# Patient Record
Sex: Female | Born: 1980 | Race: White | Hispanic: No | Smoking: Current every day smoker
Health system: Southern US, Community
[De-identification: ages and names within clinical notes are randomized; demographics above are authoritative.]

## PROBLEM LIST (undated history)

## (undated) DIAGNOSIS — G8929 Other chronic pain: Secondary | ICD-10-CM

## (undated) DIAGNOSIS — IMO0002 Reserved for concepts with insufficient information to code with codable children: Secondary | ICD-10-CM

## (undated) DIAGNOSIS — M549 Dorsalgia, unspecified: Secondary | ICD-10-CM

## (undated) DIAGNOSIS — M797 Fibromyalgia: Secondary | ICD-10-CM

## (undated) HISTORY — PX: ACHILLES TENDON REPAIR: SUR1153

## (undated) HISTORY — PX: PLEURAL SCARIFICATION: SHX748

## (undated) HISTORY — PX: TONSILLECTOMY: SUR1361

---

## 2009-12-14 ENCOUNTER — Emergency Department (HOSPITAL_COMMUNITY): Admission: EM | Admit: 2009-12-14 | Discharge: 2009-12-14 | Payer: Self-pay | Admitting: Emergency Medicine

## 2010-12-11 LAB — URINALYSIS, ROUTINE W REFLEX MICROSCOPIC
Nitrite: NEGATIVE
Specific Gravity, Urine: 1.025 (ref 1.005–1.030)
pH: 6.5 (ref 5.0–8.0)

## 2010-12-11 LAB — PREGNANCY, URINE: Preg Test, Ur: NEGATIVE

## 2011-03-27 ENCOUNTER — Encounter: Payer: Self-pay | Admitting: *Deleted

## 2011-03-27 ENCOUNTER — Emergency Department (HOSPITAL_COMMUNITY)
Admission: EM | Admit: 2011-03-27 | Discharge: 2011-03-27 | Disposition: A | Discharge status: Home / Self Care | Payer: Medicaid Other | Attending: Emergency Medicine | Admitting: Emergency Medicine

## 2011-03-27 DIAGNOSIS — M549 Dorsalgia, unspecified: Secondary | ICD-10-CM | POA: Insufficient documentation

## 2011-03-27 DIAGNOSIS — R21 Rash and other nonspecific skin eruption: Secondary | ICD-10-CM | POA: Diagnosis present

## 2011-03-27 DIAGNOSIS — F17200 Nicotine dependence, unspecified, uncomplicated: Secondary | ICD-10-CM | POA: Insufficient documentation

## 2011-03-27 HISTORY — DX: Dorsalgia, unspecified: M54.9

## 2011-03-27 HISTORY — DX: Other chronic pain: G89.29

## 2011-03-27 MED ORDER — OXYCODONE-ACETAMINOPHEN 5-325 MG PO TABS: 1.0000 | ORAL_TABLET | ORAL | Status: AC | PRN

## 2011-03-27 MED ORDER — DOXYCYCLINE HYCLATE 100 MG PO CAPS: 100.0000 mg | ORAL_CAPSULE | Freq: Two times a day (BID) | ORAL | Status: AC

## 2011-03-27 MED ORDER — OXYCODONE-ACETAMINOPHEN 5-325 MG PO TABS
1.0000 | ORAL_TABLET | Freq: Once | ORAL | Status: AC
  Administered 2011-03-27: 1 via ORAL
  Filled 2011-03-27: qty 1

## 2011-03-27 MED ORDER — TETANUS-DIPHTH-ACELL PERTUSSIS 5-2-15.5 LF-MCG/0.5 IM SUSP
0.5000 mL | Freq: Once | INTRAMUSCULAR | Status: AC
  Administered 2011-03-27: 0.5 mL via INTRAMUSCULAR
  Filled 2011-03-27: qty 0.5

## 2011-03-27 NOTE — ED Notes (Signed)
Patient with c/o red, raised rash to back. Patient with new tattoos placed Sunday July 1st. New tattoos have yellow patches and are peeling. Patient reports pain in area. Patient has blister appearing areas to back and under right ear. Patient also reporting lower back pain.

## 2011-03-27 NOTE — ED Notes (Signed)
C/o ? Poison oak to lower back on site of new tattoos; c/o blister like area under right ear on neck first noticed this morning

## 2011-03-27 NOTE — ED Provider Notes (Signed)
History     Chief Complaint  Patient presents with  . Rash   Patient is a 30 y.o. female presenting with rash. The history is provided by the patient. No language interpreter was used.  Rash  This is a new problem. Episode onset: 1 week ago. The problem has not changed since onset.Associated with: new tattoos. There has been no fever. Affected Location: lower back and upper neck. The pain is moderate. The pain has been constant since onset. Associated symptoms include blisters, itching and pain. Pertinent negatives include no weeping. She has tried nothing for the symptoms.  C/o scattered pustules to lower back and upper neck at site of new tatoos onset 1 week ago and persistent since with associated pain with areas surrounding pustules and pruritus. Patient reports she had new tatoos done 9 days ago, with no problems for the first 2 days after tatoos finished but rash beginning 3 days after tatoos. Patient reports tatoos were performed at home and not in tatoo shop. Patient also reports blister with surrounding redness under right ear with associated yellow drainage this morning. Denies fever, recent insect bites and other new exposures. Tetanus status unknown.  Patient seen at 6:04PM    Past Medical History  Diagnosis Date  . Back pain, chronic   . Pneumothorax     Past Surgical History  Procedure Date  . Achilles tendon repair   . Pleural scarification     No family history on file.  History  Substance Use Topics  . Smoking status: Current Everyday Smoker -- 1.0 packs/day    Types: Cigarettes  . Smokeless tobacco: Not on file  . Alcohol Use: No    OB History    Grav Para Term Preterm Abortions TAB SAB Ect Mult Living                  Review of Systems  Constitutional: Negative for fever and chills.  HENT: Negative for neck stiffness.   Respiratory: Negative for shortness of breath.   Cardiovascular: Negative for chest pain.  Gastrointestinal: Negative for abdominal  pain.  Skin: Positive for itching and rash (with associated pain about areas).       Blisters/pustules.   Neurological: Negative for headaches.  All other systems reviewed and are negative.    Physical Exam  BP 127/61  Pulse 83  Temp(Src) 99 F (37.2 C) (Oral)  Resp 20  Ht 5\' 8"  (1.727 m)  Wt 159 lb 5 oz (72.264 kg)  BMI 24.22 kg/m2  SpO2 100%  LMP 03/02/2011  Physical Exam CONSTITUTIONAL: Well developed/well nourished, vitals normal HEAD AND FACE: Normocephalic/atraumatic EYES: EOMI/PERRL ENMT: Mucous membranes moist NECK: supple no meningeal signs SPINE:entire spine nontender CV: S1/S2 noted, no murmurs/rubs/gallops noted LUNGS: Lungs are clear to auscultation bilaterally, no apparent distress ABDOMEN: soft, nontender NEURO: Pt is awake/alert, moves all extremitiesx4 EXTREMITIES: pulses normal, full ROM, no edema SKIN: warm, color normal, small area of erythema under right ear with no drainage and no abscess, Tatoos of upper neck and lower back with small pustules surrounding with no abscess,streaking or crepitus noted. PSYCH: no abnormalities of mood noted  ED Course  Procedures  MDM Nursing notes reviewed and considered in documentation Advised abx, pain control, benadryl and if no improvement f/u with derm  I personally performed the services described in this documentation, which was scribed in my presence. The recorded information has been reviewed and considered.      Joya Gaskins, MD 03/27/11 Rickey Primus

## 2011-04-18 ENCOUNTER — Encounter (HOSPITAL_COMMUNITY): Payer: Self-pay | Admitting: *Deleted

## 2011-04-18 DIAGNOSIS — S335XXA Sprain of ligaments of lumbar spine, initial encounter: Secondary | ICD-10-CM | POA: Insufficient documentation

## 2011-04-18 DIAGNOSIS — G43909 Migraine, unspecified, not intractable, without status migrainosus: Secondary | ICD-10-CM | POA: Insufficient documentation

## 2011-04-18 DIAGNOSIS — X58XXXA Exposure to other specified factors, initial encounter: Secondary | ICD-10-CM | POA: Insufficient documentation

## 2011-04-18 DIAGNOSIS — F17200 Nicotine dependence, unspecified, uncomplicated: Secondary | ICD-10-CM | POA: Insufficient documentation

## 2011-04-18 NOTE — ED Notes (Signed)
C/o lower back pain for years and today migraine HA, denies N/V

## 2011-04-19 ENCOUNTER — Emergency Department (HOSPITAL_COMMUNITY): Payer: Medicaid Other

## 2011-04-19 ENCOUNTER — Emergency Department (HOSPITAL_COMMUNITY)
Admission: EM | Admit: 2011-04-19 | Discharge: 2011-04-19 | Disposition: A | Discharge status: Home / Self Care | Payer: Medicaid Other | Attending: Emergency Medicine | Admitting: Emergency Medicine

## 2011-04-19 DIAGNOSIS — M549 Dorsalgia, unspecified: Secondary | ICD-10-CM

## 2011-04-19 DIAGNOSIS — R51 Headache: Secondary | ICD-10-CM

## 2011-04-19 MED ORDER — TRAMADOL HCL 50 MG PO TABS: 50.0000 mg | ORAL_TABLET | Freq: Four times a day (QID) | ORAL | Status: AC | PRN

## 2011-04-19 MED ORDER — SODIUM CHLORIDE 0.9 % IV SOLN
Freq: Once | INTRAVENOUS | Status: AC
  Administered 2011-04-19: 05:00:00 via INTRAVENOUS

## 2011-04-19 MED ORDER — KETOROLAC TROMETHAMINE 30 MG/ML IJ SOLN
30.0000 mg | Freq: Once | INTRAMUSCULAR | Status: AC
  Administered 2011-04-19: 30 mg via INTRAVENOUS
  Filled 2011-04-19: qty 1

## 2011-04-19 MED ORDER — HYDROMORPHONE HCL 2 MG/ML IJ SOLN
2.0000 mg | Freq: Once | INTRAMUSCULAR | Status: AC
  Administered 2011-04-19: 2 mg via INTRAVENOUS
  Filled 2011-04-19: qty 1

## 2011-04-19 MED ORDER — HYDROMORPHONE HCL 1 MG/ML IJ SOLN
1.0000 mg | Freq: Once | INTRAMUSCULAR | Status: AC
  Administered 2011-04-19: 1 mg via INTRAVENOUS
  Filled 2011-04-19: qty 1

## 2011-04-19 MED ORDER — ONDANSETRON HCL 4 MG/2ML IJ SOLN
4.0000 mg | Freq: Once | INTRAMUSCULAR | Status: AC
  Administered 2011-04-19: 4 mg via INTRAVENOUS
  Filled 2011-04-19: qty 2

## 2011-04-19 NOTE — ED Provider Notes (Signed)
History     CSN: [id number] Arrival date & time: 04/19/2011  1:05 AM  Chief Complaint  Patient presents with  . Back Pain  . Migraine   Patient is a 30 y.o. female presenting with back pain and migraine. The history is provided by the patient.  Back Pain  This is a new (The patient states that she started with lower back pain today after she did much lifting she also complains of severe headache frontal headache) problem. The current episode started yesterday. The problem occurs daily. The problem has not changed since onset.The pain is associated with no known injury. The pain is present in the lumbar spine. The quality of the pain is described as shooting. The pain is at a severity of 3/10. The symptoms are aggravated by twisting. Associated symptoms include headaches. Pertinent negatives include no chest pain, no abdominal pain, no bladder incontinence and no dysuria.  Migraine Associated symptoms include headaches. Pertinent negatives include no chest pain and no abdominal pain.    Past Medical History  Diagnosis Date  . Back pain, chronic   . Pneumothorax   . Migraine     Past Surgical History  Procedure Date  . Achilles tendon repair   . Pleural scarification     History reviewed. No pertinent family history.  History  Substance Use Topics  . Smoking status: Current Everyday Smoker -- 1.0 packs/day    Types: Cigarettes  . Smokeless tobacco: Not on file  . Alcohol Use: No    OB History    Grav Para Term Preterm Abortions TAB SAB Ect Mult Living                  Review of Systems  Constitutional: Negative for fatigue.  HENT: Negative for congestion, sinus pressure and ear discharge.   Eyes: Negative for discharge.  Respiratory: Negative for cough.   Cardiovascular: Negative for chest pain.  Gastrointestinal: Negative for abdominal pain and diarrhea.  Genitourinary: Negative for bladder incontinence, dysuria, frequency and hematuria.  Musculoskeletal: Positive  for back pain.  Skin: Negative for rash.  Neurological: Positive for headaches. Negative for seizures.  Hematological: Negative.   Psychiatric/Behavioral: Negative for hallucinations.    Physical Exam  BP 122/74  Pulse 68  Temp(Src) 98.1 F (36.7 C) (Oral)  Resp 16  Ht 5\' 8"  (1.727 m)  Wt 156 lb (70.761 kg)  BMI 23.72 kg/m2  SpO2 100%  LMP 03/02/2011  Physical Exam  Constitutional: She is oriented to person, place, and time. She appears well-developed.  HENT:  Head: Normocephalic and atraumatic.  Eyes: Conjunctivae and EOM are normal. No scleral icterus.  Neck: Neck supple. No thyromegaly present.  Cardiovascular: Normal rate and regular rhythm.  Exam reveals no gallop and no friction rub.   No murmur heard. Pulmonary/Chest: No stridor. She has no wheezes. She has no rales. She exhibits no tenderness.  Abdominal: She exhibits no distension. There is no tenderness. There is no rebound.  Musculoskeletal: Normal range of motion. She exhibits no edema.       Tender lumbar spine.  Neg straight leg raise  Lymphadenopathy:    She has no cervical adenopathy.  Neurological: She is oriented to person, place, and time. Coordination normal.  Skin: No rash noted. No erythema.  Psychiatric: She has a normal mood and affect. Her behavior is normal.    ED Course  Procedures  MDM    Lumbar strain and migraine headache.  Pt improved with tx  Benny Lennert, MD 04/19/11 [phone]

## 2011-04-19 NOTE — ED Notes (Signed)
Pt states she is not feeling any better at this time. Pt requesting pain medication at this time. EDP notified.

## 2011-04-19 NOTE — ED Notes (Signed)
Chronic back pain, no PCP or pain management dr. Andrey Cota she gets a shot of dilauid for her HA.

## 2011-05-25 ENCOUNTER — Ambulatory Visit (INDEPENDENT_AMBULATORY_CARE_PROVIDER_SITE_OTHER): Payer: Medicaid Other | Admitting: Family Medicine

## 2011-05-25 ENCOUNTER — Encounter: Payer: Self-pay | Admitting: Family Medicine

## 2011-05-25 ENCOUNTER — Ambulatory Visit (HOSPITAL_COMMUNITY)
Admission: RE | Admit: 2011-05-25 | Discharge: 2011-05-25 | Disposition: A | Discharge status: Home / Self Care | Payer: Medicaid Other | Source: Ambulatory Visit | Attending: Family Medicine | Admitting: Family Medicine

## 2011-05-25 VITALS — BP 124/80 | HR 72 | Resp 16 | Ht 68.0 in | Wt 152.1 lb

## 2011-05-25 DIAGNOSIS — Z720 Tobacco use: Secondary | ICD-10-CM

## 2011-05-25 DIAGNOSIS — R059 Cough, unspecified: Secondary | ICD-10-CM | POA: Insufficient documentation

## 2011-05-25 DIAGNOSIS — Z23 Encounter for immunization: Secondary | ICD-10-CM

## 2011-05-25 DIAGNOSIS — R05 Cough: Secondary | ICD-10-CM | POA: Insufficient documentation

## 2011-05-25 DIAGNOSIS — F410 Panic disorder [episodic paroxysmal anxiety] without agoraphobia: Secondary | ICD-10-CM

## 2011-05-25 DIAGNOSIS — M549 Dorsalgia, unspecified: Secondary | ICD-10-CM | POA: Insufficient documentation

## 2011-05-25 DIAGNOSIS — F17200 Nicotine dependence, unspecified, uncomplicated: Secondary | ICD-10-CM | POA: Insufficient documentation

## 2011-05-25 MED ORDER — INFLUENZA VAC TYPES A & B PF IM SUSP: 0.5000 mL | Freq: Once | INTRAMUSCULAR | Status: DC

## 2011-05-25 MED ORDER — GUAIFENESIN-CODEINE 100-10 MG/5ML PO SYRP: 5.0000 mL | ORAL_SOLUTION | Freq: Two times a day (BID) | ORAL | Status: DC | PRN

## 2011-05-25 NOTE — Assessment & Plan Note (Signed)
And will have her followup with her orthopedic surgeon. No change in medications. I advised her not treat her pain with hydrocodone which she pulled an old prescription from her purse. I do not know what actual pathology she has in her back at this time. No red flags on exam today

## 2011-05-25 NOTE — Assessment & Plan Note (Signed)
We will have to discuss this in more detail. It appears the [facility] in [place] is actually prescribing her Xanax for her panic attacks. I will obtain records from their office

## 2011-05-25 NOTE — Assessment & Plan Note (Signed)
Persistent cough x2 months. She is a smoker therefore will obtain an x-ray. Based on exam likely viral as well as the history with her children being sick. Given Robitussin A.C.

## 2011-05-25 NOTE — Progress Notes (Signed)
  Subjective:    Patient ID: Faith Santos, female    DOB: 04-Jul-1981, 30 y.o.   MRN: [id number]  HPI  Pt here to establish care, medications and history reviewed   1. Back pain- chronic back pain for years, recently has been more progressive, states she has disc problems, had a recent MRI, she follows with [facility]- Dr. Fonnie Jarvis, seen approx 2 weeks ago, injured her back lifting, has lower back pain radiates to both hip L >R, and radiation to left knee. She is very stiff in the morning. Stay at home mother. ROS- no change in bowel or bladder, +tingling in left foot    Currently taking Meloxicam and Robaxin, s/p Medrol dose pak  Seen in ED in August for pain. Asking for pain medication today   2. Cough- 2 months of , productive cough- yellow sputum, smoker- 1ppd, no history of lung disease, no wheezing, no SOB, No CP. Her children have had viruses back and forth   3. Panic attacks-- diagnosed 2 years ago, has been on xanax, takes three times a day -- diagnosed by the [facility], husband went to prison 2 years ago, single parent    4. PAP smear done in April-- Started on Depo in April 2012, next shot due in October 2012 - will continue to go to the [facility]           Review of Systems    GEN- denies fatigue, fever, weight loss,weakness,  HEENT- denies eye drainage, change in vision, nasal discharge, CVS- denies chest pain, palpitations RESP- denies SOB, +cough, wheeze MSK- + joint pain, muscle aches, injury Psych- +panic attacks, +stress        Objective:   Physical Exam GEN- NAD, alert and oriented x3, pleasant HEENT- PERRL, EOMI, non injected sclera, pink conjunctiva, MMM, mild injection of oropharynx, Fundoscopic exam benign Neck- Supple, no thryomegaly CVS- RRR, no murmur RESP-CTAB, harsh cough, no wheeze  EXT- No edema Neuro- +SLR on left side, DTR symmetric bilat, Motor 4+/5 Left, Right 5/5, sensation grossly in tact  MSK- TTP lumbar  spine, paraspinals b is ailat, pain with extension and flexion  Pulses- Radial, DP- 2+ Skin-  Multiple tattoos Psych- not anxious or depressed appearing, normal mentation     Assessment & Plan:

## 2011-05-25 NOTE — Assessment & Plan Note (Signed)
Patient advised to quit smoking. She does not appear ready at this time. She uses it as stress relief

## 2011-05-25 NOTE — Progress Notes (Signed)
Addended by: Adella Hare B on: 05/25/2011 10:08 AM   Modules accepted: Orders

## 2011-05-25 NOTE — Patient Instructions (Signed)
We will get an x-ray of your lungs Use the cough medicine as needed  I have given you a flu shot Follow-up with your back doctor I will get records from the [facility] I recommend that you quit smoking!  F/U 6 weeks

## 2011-05-30 ENCOUNTER — Encounter: Payer: Self-pay | Admitting: Family Medicine

## 2011-05-31 ENCOUNTER — Ambulatory Visit (INDEPENDENT_AMBULATORY_CARE_PROVIDER_SITE_OTHER): Payer: Medicaid Other | Admitting: Family Medicine

## 2011-05-31 ENCOUNTER — Encounter: Payer: Self-pay | Admitting: Family Medicine

## 2011-05-31 VITALS — BP 130/82 | HR 66 | Resp 16 | Ht 68.0 in | Wt 151.1 lb

## 2011-05-31 DIAGNOSIS — M549 Dorsalgia, unspecified: Secondary | ICD-10-CM

## 2011-05-31 MED ORDER — TRAMADOL HCL 50 MG PO TABS: 50.0000 mg | ORAL_TABLET | Freq: Three times a day (TID) | ORAL | Status: AC

## 2011-05-31 NOTE — Patient Instructions (Signed)
For your back, continue the muscle relaxant as needed Use the ultram three times a day as needed for severe pain  Use heating pad as needed Avoid any heavy lifting Start on the exercises, since you can not make it to Physical therapy

## 2011-06-01 NOTE — Assessment & Plan Note (Addendum)
Patient has  very mild pathology in the back. This should not cause severe pain. It is likely that she is under a lot of stress as she is mentioned before with her home life situation. I discussed with her she does not need narcotic medication to treat her back pain. I have given her a trial of tramadol and she may continue the Robaxin as needed. She was also given a handout on back exercises. I also agreed that she may not need steroid injections at this time. Of note I did call the orthopedic office in his nurse reviewed and noted with me there was no mention of any intervention such as steroid injections she was referred to physical therapy which she declined. The note also states she's to followup when necessary

## 2011-06-01 NOTE — Progress Notes (Signed)
  Subjective:    Patient ID: Faith Santos, female    DOB: 1981/08/10, 30 y.o.   MRN: [id number]  HPI  Patient here to followup back pain. She had  MRI done by [facility] orthopedics 05/04/11. MRI of lumbar spine results mild disc degeneration with tear of our annulus at this right posterior lateral quadrant. No neural displacement. Mild facet arthrosis, no evidence of nerve compression. Patient states her pain is severe daily she almost goes to the ED most nights. She would like something for pain as the orthopedic doctor did not give her anything. She also states that he said she would need steroid injections in her back however she does not want to pursue this. There is been no change in bowel or bladder since our last visit one week ago.   Review of Systems No change in bowel or bladder,no new parethesia     Objective:   Physical Exam GEN- NAD, alert and oriented, comfortable sitting in chair  Neuro- no change in gait       Assessment & Plan:

## 2011-06-11 ENCOUNTER — Telehealth: Payer: Self-pay | Admitting: Family Medicine

## 2011-06-11 NOTE — Telephone Encounter (Signed)
Wants to know if there is something else she can take for her back pain since the tramadol isn't helping?

## 2011-06-11 NOTE — Telephone Encounter (Signed)
pls send to PCP, Dr. Jeanice Lim

## 2011-06-12 ENCOUNTER — Telehealth: Payer: Self-pay | Admitting: Family Medicine

## 2011-06-12 NOTE — Telephone Encounter (Signed)
Called patient no answer.

## 2011-06-12 NOTE — Telephone Encounter (Signed)
In my note I discussed with her at the last visit , i will not treat her with narcotics. She can take Aleve or tylenol over the counter She can also increase the bedtime dose of ultram to 100mg 

## 2011-06-12 NOTE — Telephone Encounter (Signed)
Patient aware and said she will just have to go to the ER

## 2011-06-12 NOTE — Telephone Encounter (Signed)
Please see below message.

## 2011-06-12 NOTE — Telephone Encounter (Signed)
See previous note

## 2011-06-28 ENCOUNTER — Telehealth: Payer: Self-pay | Admitting: Family Medicine

## 2011-07-02 ENCOUNTER — Telehealth: Payer: Self-pay | Admitting: Family Medicine

## 2011-07-02 NOTE — Telephone Encounter (Signed)
As I told her a few weeks ago, I will not prescribe narcotics, she can use Aleve, or the ultram.

## 2011-07-05 NOTE — Telephone Encounter (Signed)
Duplicate message regarding same issue. See previous note

## 2011-07-06 ENCOUNTER — Ambulatory Visit: Payer: Medicaid Other | Admitting: Family Medicine

## 2011-07-06 ENCOUNTER — Telehealth: Payer: Self-pay | Admitting: Family Medicine

## 2011-07-10 MED ORDER — GUAIFENESIN-CODEINE 100-10 MG/5ML PO SYRP: 5.0000 mL | ORAL_SOLUTION | Freq: Two times a day (BID) | ORAL | Status: AC | PRN

## 2011-07-10 NOTE — Telephone Encounter (Signed)
Refill sent.

## 2013-05-09 ENCOUNTER — Emergency Department (HOSPITAL_COMMUNITY): Payer: Medicaid Other

## 2013-05-09 ENCOUNTER — Encounter (HOSPITAL_COMMUNITY): Payer: Self-pay | Admitting: *Deleted

## 2013-05-09 ENCOUNTER — Emergency Department (HOSPITAL_COMMUNITY)
Admission: EM | Admit: 2013-05-09 | Discharge: 2013-05-09 | Disposition: A | Discharge status: Home / Self Care | Payer: Medicaid Other | Attending: Emergency Medicine | Admitting: Emergency Medicine

## 2013-05-09 DIAGNOSIS — Z791 Long term (current) use of non-steroidal anti-inflammatories (NSAID): Secondary | ICD-10-CM | POA: Insufficient documentation

## 2013-05-09 DIAGNOSIS — S300XXA Contusion of lower back and pelvis, initial encounter: Secondary | ICD-10-CM

## 2013-05-09 DIAGNOSIS — Z79899 Other long term (current) drug therapy: Secondary | ICD-10-CM | POA: Insufficient documentation

## 2013-05-09 DIAGNOSIS — Z8709 Personal history of other diseases of the respiratory system: Secondary | ICD-10-CM | POA: Insufficient documentation

## 2013-05-09 DIAGNOSIS — S20229A Contusion of unspecified back wall of thorax, initial encounter: Secondary | ICD-10-CM | POA: Insufficient documentation

## 2013-05-09 DIAGNOSIS — W010XXA Fall on same level from slipping, tripping and stumbling without subsequent striking against object, initial encounter: Secondary | ICD-10-CM | POA: Insufficient documentation

## 2013-05-09 DIAGNOSIS — IMO0001 Reserved for inherently not codable concepts without codable children: Secondary | ICD-10-CM | POA: Insufficient documentation

## 2013-05-09 DIAGNOSIS — F17200 Nicotine dependence, unspecified, uncomplicated: Secondary | ICD-10-CM | POA: Insufficient documentation

## 2013-05-09 DIAGNOSIS — Y9389 Activity, other specified: Secondary | ICD-10-CM | POA: Insufficient documentation

## 2013-05-09 DIAGNOSIS — S8990XA Unspecified injury of unspecified lower leg, initial encounter: Secondary | ICD-10-CM | POA: Insufficient documentation

## 2013-05-09 DIAGNOSIS — W108XXA Fall (on) (from) other stairs and steps, initial encounter: Secondary | ICD-10-CM | POA: Insufficient documentation

## 2013-05-09 DIAGNOSIS — M5137 Other intervertebral disc degeneration, lumbosacral region: Secondary | ICD-10-CM | POA: Insufficient documentation

## 2013-05-09 DIAGNOSIS — M5136 Other intervertebral disc degeneration, lumbar region: Secondary | ICD-10-CM

## 2013-05-09 DIAGNOSIS — M51379 Other intervertebral disc degeneration, lumbosacral region without mention of lumbar back pain or lower extremity pain: Secondary | ICD-10-CM | POA: Insufficient documentation

## 2013-05-09 DIAGNOSIS — Y9289 Other specified places as the place of occurrence of the external cause: Secondary | ICD-10-CM | POA: Insufficient documentation

## 2013-05-09 DIAGNOSIS — G8929 Other chronic pain: Secondary | ICD-10-CM | POA: Insufficient documentation

## 2013-05-09 DIAGNOSIS — Z8679 Personal history of other diseases of the circulatory system: Secondary | ICD-10-CM | POA: Insufficient documentation

## 2013-05-09 HISTORY — DX: Fibromyalgia: M79.7

## 2013-05-09 HISTORY — DX: Reserved for concepts with insufficient information to code with codable children: IMO0002

## 2013-05-09 MED ORDER — OXYCODONE-ACETAMINOPHEN 5-325 MG PO TABS
1.0000 | ORAL_TABLET | Freq: Once | ORAL | Status: AC
  Administered 2013-05-09: 1 via ORAL
  Filled 2013-05-09: qty 1

## 2013-05-09 MED ORDER — NAPROXEN 500 MG PO TABS: 500.0000 mg | ORAL_TABLET | Freq: Two times a day (BID) | ORAL | Status: DC

## 2013-05-09 MED ORDER — HYDROCODONE-ACETAMINOPHEN 5-325 MG PO TABS: 1.0000 | ORAL_TABLET | ORAL | Status: DC | PRN

## 2013-05-09 MED ORDER — CYCLOBENZAPRINE HCL 10 MG PO TABS: 10.0000 mg | ORAL_TABLET | Freq: Two times a day (BID) | ORAL | Status: DC | PRN

## 2013-05-09 NOTE — ED Notes (Addendum)
Pt states that she slipped on her steps causing her to slide down the steps hitting her lower back area, states that the pain radiates down both legs.
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
## 2013-05-09 NOTE — Discharge Instructions (Signed)
Contusion A contusion is a deep bruise. Contusions are the result of an injury that caused bleeding under the skin. The contusion may turn blue, purple, or yellow. Minor injuries will give you a painless contusion, but more severe contusions may stay painful and swollen for a few weeks.  CAUSES  A contusion is usually caused by a blow, trauma, or direct force to an area of the body. SYMPTOMS   Swelling and redness of the injured area.  Bruising of the injured area.  Tenderness and soreness of the injured area.  Pain. DIAGNOSIS  The diagnosis can be made by taking a history and physical exam. An X-ray, CT scan, or MRI may be needed to determine if there were any associated injuries, such as fractures. TREATMENT  Specific treatment will depend on what area of the body was injured. In general, the best treatment for a contusion is resting, icing, elevating, and applying cold compresses to the injured area. Over-the-counter medicines may also be recommended for pain control. Ask your caregiver what the best treatment is for your contusion. HOME CARE INSTRUCTIONS   Put ice on the injured area.  Put ice in a plastic bag.  Place a towel between your skin and the bag.  Leave the ice on for 15-20 minutes, 3-4 times a day.  Only take over-the-counter or prescription medicines for pain, discomfort, or fever as directed by your caregiver. Your caregiver may recommend avoiding anti-inflammatory medicines (aspirin, ibuprofen, and naproxen) for 48 hours because these medicines may increase bruising.  Rest the injured area.  If possible, elevate the injured area to reduce swelling. SEEK IMMEDIATE MEDICAL CARE IF:   You have increased bruising or swelling.  You have pain that is getting worse.  Your swelling or pain is not relieved with medicines. MAKE SURE YOU:   Understand these instructions.  Will watch your condition.  Will get help right away if you are not doing well or get  worse. Document Released: 06/13/2005 Document Revised: 11/26/2011 Document Reviewed: 07/09/2011 [employer] Patient Information 2014 [employer].  

## 2013-05-09 NOTE — ED Notes (Signed)
Ride for pt verified,

## 2013-05-10 NOTE — ED Provider Notes (Signed)
Medical screening examination/treatment/procedure(s) were performed by non-physician practitioner and as supervising physician I was immediately available for consultation/collaboration.  Hurman Horn, MD 05/10/13 [phone]

## 2013-05-11 ENCOUNTER — Telehealth: Payer: Self-pay | Admitting: Orthopedic Surgery

## 2013-05-11 NOTE — Telephone Encounter (Signed)
Patient called to request appointment for back pain as follow-up from [facility] ER.  She as [employer], told her we will need a referral from the PCP on her card.  Also said she had been seen at [facility] about 2 years ago for her back.  Told her she will also need to get those notes for review  By Dr. Romeo Apple.

## 2013-12-17 ENCOUNTER — Emergency Department (HOSPITAL_COMMUNITY): Payer: Medicaid Other

## 2013-12-17 ENCOUNTER — Encounter (HOSPITAL_COMMUNITY): Payer: Self-pay | Admitting: Emergency Medicine

## 2013-12-17 ENCOUNTER — Emergency Department (HOSPITAL_COMMUNITY)
Admission: EM | Admit: 2013-12-17 | Discharge: 2013-12-17 | Disposition: A | Discharge status: Home / Self Care | Payer: Medicaid Other | Attending: Emergency Medicine | Admitting: Emergency Medicine

## 2013-12-17 DIAGNOSIS — Y929 Unspecified place or not applicable: Secondary | ICD-10-CM | POA: Insufficient documentation

## 2013-12-17 DIAGNOSIS — F17200 Nicotine dependence, unspecified, uncomplicated: Secondary | ICD-10-CM | POA: Insufficient documentation

## 2013-12-17 DIAGNOSIS — Y9301 Activity, walking, marching and hiking: Secondary | ICD-10-CM | POA: Insufficient documentation

## 2013-12-17 DIAGNOSIS — G8929 Other chronic pain: Secondary | ICD-10-CM | POA: Insufficient documentation

## 2013-12-17 DIAGNOSIS — Z8679 Personal history of other diseases of the circulatory system: Secondary | ICD-10-CM | POA: Insufficient documentation

## 2013-12-17 DIAGNOSIS — S93401A Sprain of unspecified ligament of right ankle, initial encounter: Secondary | ICD-10-CM

## 2013-12-17 DIAGNOSIS — X500XXA Overexertion from strenuous movement or load, initial encounter: Secondary | ICD-10-CM | POA: Insufficient documentation

## 2013-12-17 DIAGNOSIS — S93409A Sprain of unspecified ligament of unspecified ankle, initial encounter: Secondary | ICD-10-CM | POA: Insufficient documentation

## 2013-12-17 DIAGNOSIS — Z79899 Other long term (current) drug therapy: Secondary | ICD-10-CM | POA: Insufficient documentation

## 2013-12-17 DIAGNOSIS — Z8709 Personal history of other diseases of the respiratory system: Secondary | ICD-10-CM | POA: Insufficient documentation

## 2013-12-17 DIAGNOSIS — Z8739 Personal history of other diseases of the musculoskeletal system and connective tissue: Secondary | ICD-10-CM | POA: Insufficient documentation

## 2013-12-17 MED ORDER — IBUPROFEN 400 MG PO TABS
600.0000 mg | ORAL_TABLET | Freq: Once | ORAL | Status: AC
  Administered 2013-12-17: 600 mg via ORAL
  Filled 2013-12-17: qty 2

## 2013-12-17 MED ORDER — IBUPROFEN 600 MG PO TABS: 600.0000 mg | ORAL_TABLET | Freq: Three times a day (TID) | ORAL | Status: DC | PRN
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
## 2013-12-17 NOTE — ED Notes (Signed)
Pt c/o right ankle pain after twisting it walking up porch stairs today.

## 2013-12-17 NOTE — Discharge Instructions (Signed)

## 2014-08-30 ENCOUNTER — Emergency Department (HOSPITAL_COMMUNITY)
Admission: EM | Admit: 2014-08-30 | Discharge: 2014-08-30 | Disposition: A | Discharge status: Home / Self Care | Payer: Medicaid Other | Attending: Emergency Medicine | Admitting: Emergency Medicine

## 2014-08-30 ENCOUNTER — Encounter (HOSPITAL_COMMUNITY): Payer: Self-pay | Admitting: *Deleted

## 2014-08-30 DIAGNOSIS — Z79899 Other long term (current) drug therapy: Secondary | ICD-10-CM | POA: Diagnosis not present

## 2014-08-30 DIAGNOSIS — K0889 Other specified disorders of teeth and supporting structures: Secondary | ICD-10-CM

## 2014-08-30 DIAGNOSIS — Z720 Tobacco use: Secondary | ICD-10-CM | POA: Insufficient documentation

## 2014-08-30 DIAGNOSIS — G8929 Other chronic pain: Secondary | ICD-10-CM | POA: Insufficient documentation

## 2014-08-30 DIAGNOSIS — Z8679 Personal history of other diseases of the circulatory system: Secondary | ICD-10-CM | POA: Insufficient documentation

## 2014-08-30 DIAGNOSIS — K088 Other specified disorders of teeth and supporting structures: Secondary | ICD-10-CM | POA: Insufficient documentation

## 2014-08-30 MED ORDER — AMOXICILLIN 500 MG PO CAPS: 500.0000 mg | ORAL_CAPSULE | Freq: Three times a day (TID) | ORAL | Status: DC

## 2014-08-30 MED ORDER — AMOXICILLIN 250 MG PO CAPS
500.0000 mg | ORAL_CAPSULE | Freq: Once | ORAL | Status: AC
  Administered 2014-08-30: 500 mg via ORAL
  Filled 2014-08-30: qty 2

## 2014-08-30 MED ORDER — IBUPROFEN 800 MG PO TABS
800.0000 mg | ORAL_TABLET | Freq: Once | ORAL | Status: AC
  Administered 2014-08-30: 800 mg via ORAL
  Filled 2014-08-30: qty 1

## 2014-08-30 NOTE — Discharge Instructions (Signed)
Please ask your pharmacist about temporary filling. Please use Amoxil 3 times daily with food. May continue your ibuprofen and Ultram. It is important that she see her dentist as sone as possible. Dental Pain Toothache is pain in or around a tooth. It may get worse with chewing or with cold or heat.  HOME CARE  Your dentist may use a numbing medicine during treatment. If so, you may need to avoid eating until the medicine wears off. Ask your dentist about this.  Only take medicine as told by your dentist or doctor.  Avoid chewing food near the painful tooth until after all treatment is done. Ask your dentist about this. GET HELP RIGHT AWAY IF:   The problem gets worse or new problems appear.  You have a fever.  There is redness and puffiness (swelling) of the face, jaw, or neck.  You cannot open your mouth.  There is pain in the jaw.  There is very bad pain that is not helped by medicine. MAKE SURE YOU:   Understand these instructions.  Will watch your condition.  Will get help right away if you are not doing well or get worse. Document Released: 02/20/2008 Document Revised: 11/26/2011 Document Reviewed: 02/20/2008 [facility] Patient Information 2015 [place], [place]. This information is not intended to replace advice given to you by your health care provider. Make sure you discuss any questions you have with your health care provider.
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
## 2014-11-29 ENCOUNTER — Emergency Department (HOSPITAL_COMMUNITY): Payer: Medicaid Other

## 2014-11-29 ENCOUNTER — Emergency Department (HOSPITAL_COMMUNITY)
Admission: EM | Admit: 2014-11-29 | Discharge: 2014-11-29 | Disposition: A | Discharge status: Home / Self Care | Payer: Medicaid Other | Attending: Emergency Medicine | Admitting: Emergency Medicine

## 2014-11-29 ENCOUNTER — Encounter (HOSPITAL_COMMUNITY): Payer: Self-pay | Admitting: Emergency Medicine

## 2014-11-29 DIAGNOSIS — Z720 Tobacco use: Secondary | ICD-10-CM | POA: Diagnosis not present

## 2014-11-29 DIAGNOSIS — Y92410 Unspecified street and highway as the place of occurrence of the external cause: Secondary | ICD-10-CM | POA: Diagnosis not present

## 2014-11-29 DIAGNOSIS — Z8679 Personal history of other diseases of the circulatory system: Secondary | ICD-10-CM | POA: Diagnosis not present

## 2014-11-29 DIAGNOSIS — S42102A Fracture of unspecified part of scapula, left shoulder, initial encounter for closed fracture: Secondary | ICD-10-CM

## 2014-11-29 DIAGNOSIS — Y998 Other external cause status: Secondary | ICD-10-CM | POA: Diagnosis not present

## 2014-11-29 DIAGNOSIS — Z791 Long term (current) use of non-steroidal anti-inflammatories (NSAID): Secondary | ICD-10-CM | POA: Diagnosis not present

## 2014-11-29 DIAGNOSIS — Z79899 Other long term (current) drug therapy: Secondary | ICD-10-CM | POA: Diagnosis not present

## 2014-11-29 DIAGNOSIS — S161XXA Strain of muscle, fascia and tendon at neck level, initial encounter: Secondary | ICD-10-CM | POA: Diagnosis not present

## 2014-11-29 DIAGNOSIS — S0990XA Unspecified injury of head, initial encounter: Secondary | ICD-10-CM | POA: Diagnosis not present

## 2014-11-29 DIAGNOSIS — M797 Fibromyalgia: Secondary | ICD-10-CM | POA: Diagnosis not present

## 2014-11-29 DIAGNOSIS — G8929 Other chronic pain: Secondary | ICD-10-CM | POA: Diagnosis not present

## 2014-11-29 DIAGNOSIS — S20219A Contusion of unspecified front wall of thorax, initial encounter: Secondary | ICD-10-CM | POA: Insufficient documentation

## 2014-11-29 DIAGNOSIS — Y9389 Activity, other specified: Secondary | ICD-10-CM | POA: Diagnosis not present

## 2014-11-29 DIAGNOSIS — S42112A Displaced fracture of body of scapula, left shoulder, initial encounter for closed fracture: Secondary | ICD-10-CM | POA: Insufficient documentation

## 2014-11-29 DIAGNOSIS — Z7952 Long term (current) use of systemic steroids: Secondary | ICD-10-CM | POA: Diagnosis not present

## 2014-11-29 DIAGNOSIS — S4992XA Unspecified injury of left shoulder and upper arm, initial encounter: Secondary | ICD-10-CM | POA: Diagnosis present

## 2014-11-29 MED ORDER — SODIUM CHLORIDE 0.9 % IJ SOLN
INTRAMUSCULAR | Status: DC
Start: 2014-11-29 — End: 2014-11-30
  Filled 2014-11-29: qty 250

## 2014-11-29 MED ORDER — MORPHINE SULFATE 4 MG/ML IJ SOLN
INTRAMUSCULAR | Status: AC
  Filled 2014-11-29: qty 1

## 2014-11-29 MED ORDER — IOHEXOL 300 MG/ML  SOLN
80.0000 mL | Freq: Once | INTRAMUSCULAR | Status: AC | PRN
  Administered 2014-11-29: 80 mL via INTRAVENOUS

## 2014-11-29 MED ORDER — OXYCODONE-ACETAMINOPHEN 5-325 MG PO TABS: 1.0000 | ORAL_TABLET | Freq: Four times a day (QID) | ORAL | Status: DC | PRN

## 2014-11-29 MED ORDER — MORPHINE SULFATE 4 MG/ML IJ SOLN
4.0000 mg | Freq: Once | INTRAMUSCULAR | Status: AC
  Administered 2014-11-29: 4 mg via INTRAVENOUS

## 2014-11-29 MED ORDER — MORPHINE SULFATE 4 MG/ML IJ SOLN
4.0000 mg | Freq: Once | INTRAMUSCULAR | Status: AC
  Administered 2014-11-29: 4 mg via INTRAVENOUS
  Filled 2014-11-29: qty 1

## 2014-11-29 MED ORDER — SODIUM CHLORIDE 0.9 % IJ SOLN
INTRAMUSCULAR | Status: AC
  Filled 2014-11-29: qty 45

## 2014-11-29 NOTE — Discharge Instructions (Signed)
Wear arm sling as applied.  Percocet as needed for pain.  Follow-up with orthopedics the end of this week. Call Dr. Mort Sawyers office to arrange this appointment. His contact information has been provided in this discharge summary.  Return to the emergency department if your symptoms substantially worsen or change.   Scapular Fracture You have a fracture (break in bone) of your scapula. This is your shoulder blade. It is the large flat bone behind your shoulder. This is also the bone that makes up the ball and socket joint of your shoulder. Most of the time surgery is not required for injuries to this bone unless the socket of the shoulder joint is involved. DIAGNOSIS  Because of the severity of force usually required to break this bone, x-rays are often taken of other bones likely to be injured at the same time. X-rays of the hip, knee, and pelvis may be taken. Specialized x-rays (arteriograms) may be needed if there are injuries to large blood vessels associated with this injury. HOME CARE INSTRUCTIONS   Simple fractures of the scapula can be treated with a sling and swathe type of immobilization. This means the involved area is held in place by putting the arm in a sling. A wrap is made around the upper arm with the sling holding the arm next to the chest. This may be removed for bathing as instructed by your caregiver.  Apply ice to the injury for 15-20 minutes 03-04 times per day. Put the ice in a plastic bag. Place a towel between the bag of ice and your skin, splint, or immobilization device.  Do not resume use until instructed by your caregiver. Usually full rehabilitation (exercises to improve the injury site) will begin sometime after the sling and swathe are removed. Then begin use gradually as directed. Do not increase use to the point of pain. If pain develops, decrease use and continue the above measures. Slowly increase activities that do not cause discomfort until you gradually  achieve normal use without pain.  Only take over-the-counter or prescription medicines for pain, discomfort, or fever as directed by your caregiver. SEEK IMMEDIATE MEDICAL CARE IF:   Your pain and swelling increase and is uncontrolled with medications.  You develop new, unexplained symptoms or an increase of the symptoms which brought you to your caregiver.  You develop shortness or breath or cough up blood.  You are unable to move your arm or fingers. You develop warmth and swelling in your affected arm.  You develop an unexplained temperature. Document Released: 09/03/2005 Document Revised: 11/26/2011 Document Reviewed: 07/26/2006 [facility] Patient Information 2015 [place], [place]. This information is not intended to replace advice given to you by your health care provider. Make sure you discuss any questions you have with your health care provider.  Head Injury You have a head injury. Headaches and throwing up (vomiting) are common after a head injury. It should be easy to wake up from sleeping. Sometimes you must stay in the hospital. Most problems happen within the first 24 hours. Side effects may occur up to 7-10 days after the injury.  WHAT ARE THE TYPES OF HEAD INJURIES? Head injuries can be as minor as a bump. Some head injuries can be more severe. More severe head injuries include:  A jarring injury to the brain (concussion).  A bruise of the brain (contusion). This mean there is bleeding in the brain that can cause swelling.  A cracked skull (skull fracture).  Bleeding in the brain that collects,  clots, and forms a bump (hematoma). WHEN SHOULD I GET HELP RIGHT AWAY?   You are confused or sleepy.  You cannot be woken up.  You feel sick to your stomach (nauseous) or keep throwing up (vomiting).  Your dizziness or unsteadiness is getting worse.  You have very bad, lasting headaches that are not helped by medicine. Take medicines only as told by your doctor.  You cannot  use your arms or legs like normal.  You cannot walk.  You notice changes in the black spots in the center of the colored part of your eye (pupil).  You have clear or bloody fluid coming from your nose or ears.  You have trouble seeing. During the next 24 hours after the injury, you must stay with someone who can watch you. This person should get help right away (call 911 in the [place] if you start to shake and are not able to control it (have seizures), you pass out, or you are unable to wake up. HOW CAN I PREVENT A HEAD INJURY IN THE FUTURE?  Wear seat belts.  Wear a helmet while bike riding and playing sports like football.  Stay away from dangerous activities around the house. WHEN CAN I RETURN TO NORMAL ACTIVITIES AND ATHLETICS? See your doctor before doing these activities. You should not do normal activities or play contact sports until 1 week after the following symptoms have stopped:  Headache that does not go away.  Dizziness.  Poor attention.  Confusion.  Memory problems.  Sickness to your stomach or throwing up.  Tiredness.  Fussiness.  Bothered by bright lights or loud noises.  Anxiousness or depression.  Restless sleep. MAKE SURE YOU:   Understand these instructions.  Will watch your condition.  Will get help right away if you are not doing well or get worse. Document Released: 08/16/2008 Document Revised: 01/18/2014 Document Reviewed: 05/11/2013 [facility] Patient Information 2015 [place], [place]. This information is not intended to replace advice given to you by your health care provider. Make sure you discuss any questions you have with your health care provider.
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
## 2014-12-06 ENCOUNTER — Encounter: Payer: Self-pay | Admitting: Orthopedic Surgery

## 2014-12-06 ENCOUNTER — Ambulatory Visit (INDEPENDENT_AMBULATORY_CARE_PROVIDER_SITE_OTHER): Payer: Medicaid Other | Admitting: Orthopedic Surgery

## 2014-12-06 VITALS — BP 112/76 | Ht 68.0 in | Wt 153.0 lb

## 2014-12-06 DIAGNOSIS — S42112A Displaced fracture of body of scapula, left shoulder, initial encounter for closed fracture: Secondary | ICD-10-CM | POA: Diagnosis not present

## 2014-12-06 MED ORDER — CYCLOBENZAPRINE HCL 10 MG PO TABS: 10.0000 mg | ORAL_TABLET | Freq: Two times a day (BID) | ORAL | Status: DC | PRN

## 2014-12-06 MED ORDER — OXYCODONE-ACETAMINOPHEN 5-325 MG PO TABS: 1.0000 | ORAL_TABLET | ORAL | Status: DC | PRN

## 2014-12-06 NOTE — Patient Instructions (Addendum)
Stay in sling unless bathing  Continue Meloxicam  Out of work note
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
## 2014-12-20 ENCOUNTER — Telehealth: Payer: Self-pay | Admitting: Orthopedic Surgery

## 2014-12-20 ENCOUNTER — Other Ambulatory Visit: Payer: Self-pay | Admitting: *Deleted

## 2014-12-20 MED ORDER — OXYCODONE-ACETAMINOPHEN 5-325 MG PO TABS: 1.0000 | ORAL_TABLET | ORAL | Status: DC | PRN

## 2014-12-20 NOTE — Telephone Encounter (Signed)
Patient called to request refill on pain medication: oxyCODONE-acetaminophen (PERCOCET/ROXICET) 5-325 MG per tablet - ph# [phone]

## 2014-12-20 NOTE — Telephone Encounter (Signed)
Prescription available, patient aware  

## 2014-12-21 NOTE — Telephone Encounter (Signed)
Patient picked up Rx

## 2014-12-27 ENCOUNTER — Ambulatory Visit (INDEPENDENT_AMBULATORY_CARE_PROVIDER_SITE_OTHER): Payer: Self-pay | Admitting: Orthopedic Surgery

## 2014-12-27 VITALS — BP 140/105 | Ht 68.0 in | Wt 153.0 lb

## 2014-12-27 DIAGNOSIS — S42112D Displaced fracture of body of scapula, left shoulder, subsequent encounter for fracture with routine healing: Secondary | ICD-10-CM

## 2014-12-27 MED ORDER — HYDROCODONE-ACETAMINOPHEN 10-325 MG PO TABS: 1.0000 | ORAL_TABLET | ORAL | Status: DC | PRN

## 2014-12-27 NOTE — Patient Instructions (Signed)
HOME EXERCISES WITH SLING OFF

## 2014-12-28 ENCOUNTER — Encounter: Payer: Self-pay | Admitting: Orthopedic Surgery

## 2014-12-28 MED FILL — Oxycodone w/ Acetaminophen Tab 5-325 MG: ORAL | Qty: 6 | Status: AC

## 2014-12-28 NOTE — Progress Notes (Signed)
Patient ID: Faith ScottsJennifer B Santos, female   DOB: 02-May-1981, 34 y.o.   MRN: [id number] Chief Complaint  Patient presents with  . Follow-up    3 week follow up left scapula fx, DOI 11/29/14    The patient comes in after a scapular fracture 4 weeks ago  She notes that she is improving in terms of her pain level. She has no neurovascular deficits on exam. We change her over from Percocet to Norco 10 mg every 4 when necessary pain #84 she can start Codman exercises and wear the sling as needed for comfort  She will in 3 weeks for the 6/7 week scapular x-ray

## 2015-01-11 ENCOUNTER — Other Ambulatory Visit: Payer: Self-pay | Admitting: *Deleted

## 2015-01-11 ENCOUNTER — Telehealth: Payer: Self-pay | Admitting: Orthopedic Surgery

## 2015-01-11 MED ORDER — HYDROCODONE-ACETAMINOPHEN 5-325 MG PO TABS: 1.0000 | ORAL_TABLET | ORAL | Status: DC | PRN

## 2015-01-11 NOTE — Telephone Encounter (Signed)
Patient called to request refill of medication: HYDROcodone-acetaminophen (NORCO) 10-325 MG per tablet [[id number]][[id number]] - states has 1 more day of medication.  Her ph# is [phone]

## 2015-01-12 NOTE — Telephone Encounter (Signed)
Patient picked up Rx

## 2015-01-12 NOTE — Telephone Encounter (Signed)
Reached patient; picking up prescription today.

## 2015-01-12 NOTE — Telephone Encounter (Signed)
Prescription available, called patient, no answer 

## 2015-01-18 ENCOUNTER — Ambulatory Visit (INDEPENDENT_AMBULATORY_CARE_PROVIDER_SITE_OTHER): Payer: Medicaid Other

## 2015-01-18 ENCOUNTER — Ambulatory Visit (INDEPENDENT_AMBULATORY_CARE_PROVIDER_SITE_OTHER): Payer: Self-pay | Admitting: Orthopedic Surgery

## 2015-01-18 VITALS — BP 139/86 | Ht 68.0 in | Wt 153.0 lb

## 2015-01-18 DIAGNOSIS — S42102D Fracture of unspecified part of scapula, left shoulder, subsequent encounter for fracture with routine healing: Secondary | ICD-10-CM | POA: Diagnosis not present

## 2015-01-18 MED ORDER — IBUPROFEN 800 MG PO TABS: 800.0000 mg | ORAL_TABLET | Freq: Three times a day (TID) | ORAL | Status: DC | PRN

## 2015-01-18 MED ORDER — HYDROCODONE-ACETAMINOPHEN 5-325 MG PO TABS: 1.0000 | ORAL_TABLET | ORAL | Status: DC | PRN

## 2015-01-18 NOTE — Patient Instructions (Signed)
Ibuprofen sent to pharmacy  Home exercises

## 2015-01-18 NOTE — Progress Notes (Signed)
Patient ID: Faith Santos, female   DOB: 06-28-1981, 34 y.o.   MRN: [id number] Chief Complaint  Patient presents with  . Follow-up    follow up left scapula, DOI 11/29/14   Encounter Diagnosis  Name Primary?  . Scapular fracture, left, with routine healing, subsequent encounter Yes   Patient follows up for left scapular fracture were now entering 6 weeks. Fracture x-ray today shows fracture consolidation  Patient's range of motion is improved with Codman exercises although she is sore around the scapula and has painful range of motion after 120 of forward elevation  Recommend strengthening exercises  An ibuprofen to help wean her off of hydrocodone. Come back in a month. She should improve over the next month and we should be able to spreader medicine outer take her off the hydrocodone when she comes back

## 2015-02-02 ENCOUNTER — Telehealth: Payer: Self-pay | Admitting: Orthopedic Surgery

## 2015-02-02 ENCOUNTER — Other Ambulatory Visit: Payer: Self-pay | Admitting: *Deleted

## 2015-02-02 MED ORDER — HYDROCODONE-ACETAMINOPHEN 5-325 MG PO TABS: 1.0000 | ORAL_TABLET | ORAL | Status: DC | PRN

## 2015-02-02 NOTE — Telephone Encounter (Signed)
Prescription available, patient aware  

## 2015-02-02 NOTE — Telephone Encounter (Signed)
Patient called for refill of medication:   HYDROcodone-acetaminophen (NORCO) 10-325 MG per tablet [[id number]][[id number]]       - patient ph# [phone]

## 2015-02-03 NOTE — Telephone Encounter (Signed)
PATIENT IS ASKING IF SHE IS TO CONTINUE TAKING cyclobenzaprine (FLEXERIL) 10 MG tablet  AS WELL, IF SO SHE WILL NEED A REFILL NEXT WEEK, PLEASE ADVISE?

## 2015-02-03 NOTE — Telephone Encounter (Signed)
Patient picked up prescription.

## 2015-02-15 ENCOUNTER — Telehealth: Payer: Self-pay | Admitting: Orthopedic Surgery

## 2015-02-15 ENCOUNTER — Other Ambulatory Visit: Payer: Self-pay | Admitting: *Deleted

## 2015-02-15 MED ORDER — HYDROCODONE-ACETAMINOPHEN 5-325 MG PO TABS: 1.0000 | ORAL_TABLET | ORAL | Status: DC | PRN

## 2015-02-15 MED ORDER — CYCLOBENZAPRINE HCL 10 MG PO TABS: 10.0000 mg | ORAL_TABLET | Freq: Two times a day (BID) | ORAL | Status: DC | PRN

## 2015-02-15 NOTE — Telephone Encounter (Signed)
Patient is calling requesting a refill on HYDROcodone-acetaminophen (NORCO) 10-325 MG per tablet and and cyclobenzaprine (FLEXERIL) 10 MG tablet please advise?

## 2015-02-15 NOTE — Telephone Encounter (Signed)
Patient picked up Rx

## 2015-02-15 NOTE — Telephone Encounter (Signed)
Prescription available, patient aware  

## 2015-02-22 ENCOUNTER — Ambulatory Visit: Payer: Medicaid Other | Admitting: Orthopedic Surgery

## 2015-02-28 ENCOUNTER — Other Ambulatory Visit: Payer: Self-pay | Admitting: *Deleted

## 2015-02-28 ENCOUNTER — Other Ambulatory Visit: Payer: Self-pay | Admitting: Orthopedic Surgery

## 2015-02-28 ENCOUNTER — Telehealth: Payer: Self-pay | Admitting: Orthopedic Surgery

## 2015-02-28 MED ORDER — HYDROCODONE-ACETAMINOPHEN 5-325 MG PO TABS: 1.0000 | ORAL_TABLET | ORAL | Status: DC | PRN

## 2015-02-28 NOTE — Telephone Encounter (Signed)
Patient is calling requesting a medication refill on HYDROcodone-acetaminophen (NORCO/VICODIN) 5-325 MG per table please advise?

## 2015-02-28 NOTE — Telephone Encounter (Signed)
Not available until 6/16 per dr Romeo Apple

## 2015-03-01 NOTE — Telephone Encounter (Signed)
Patient is aware 

## 2015-03-02 ENCOUNTER — Other Ambulatory Visit: Payer: Self-pay | Admitting: *Deleted

## 2015-03-02 MED ORDER — HYDROCODONE-ACETAMINOPHEN 5-325 MG PO TABS: 1.0000 | ORAL_TABLET | ORAL | Status: DC | PRN

## 2015-03-03 ENCOUNTER — Encounter: Payer: Self-pay | Admitting: Orthopedic Surgery

## 2015-03-03 ENCOUNTER — Ambulatory Visit (INDEPENDENT_AMBULATORY_CARE_PROVIDER_SITE_OTHER): Payer: Self-pay | Admitting: Orthopedic Surgery

## 2015-03-03 VITALS — BP 140/92 | Ht 68.0 in | Wt 153.0 lb

## 2015-03-03 DIAGNOSIS — S42112D Displaced fracture of body of scapula, left shoulder, subsequent encounter for fracture with routine healing: Secondary | ICD-10-CM

## 2015-03-03 NOTE — Progress Notes (Signed)
Chief Complaint  Patient presents with  . Follow-up    1 month follow up left scapula fracture, DOI 11/29/14    BP 140/92 mmHg  Ht 5\' 8"  (1.727 m)  Wt 153 lb (69.4 kg)  BMI 23.27 kg/m2  The patient had a scapular fracture treated closed nonoperatively followed by physical therapy at home she did well she can return to work  Today's findings no tenderness no motion restrictions no weakness  Follow-up as needed

## 2015-03-03 NOTE — Telephone Encounter (Signed)
Prescription available, patient aware  

## 2015-04-05 ENCOUNTER — Telehealth: Payer: Self-pay | Admitting: Orthopedic Surgery

## 2015-04-05 NOTE — Telephone Encounter (Signed)
Ok to refill 

## 2015-04-05 NOTE — Telephone Encounter (Signed)
Cant have hydrocodone   ibuprofrn 800 tid or naprosyn 500 bid

## 2015-04-05 NOTE — Telephone Encounter (Signed)
Patient called to request refill of medication: HYDROcodone-acetaminophen (NORCO/VICODIN) 5-325 MG per tablet [[id number]][[id number]] - she was last seen 03/03/15, and is not scheduled for follow up appointment.  States she had returned to work, and had soreness after lifting and moving boxes at work.  Please advise.  Her ph# is [phone]

## 2015-04-06 ENCOUNTER — Other Ambulatory Visit: Payer: Self-pay | Admitting: *Deleted

## 2015-04-06 MED ORDER — NAPROXEN 500 MG PO TABS: 500.0000 mg | ORAL_TABLET | Freq: Two times a day (BID) | ORAL | Status: DC

## 2015-04-06 NOTE — Telephone Encounter (Signed)
Naprosyn sent as requested, patient aware

## 2015-05-06 ENCOUNTER — Emergency Department (HOSPITAL_COMMUNITY)
Admission: EM | Admit: 2015-05-06 | Discharge: 2015-05-06 | Disposition: A | Discharge status: Home / Self Care | Payer: Medicaid Other | Attending: Emergency Medicine | Admitting: Emergency Medicine

## 2015-05-06 ENCOUNTER — Encounter (HOSPITAL_COMMUNITY): Payer: Self-pay

## 2015-05-06 DIAGNOSIS — Y998 Other external cause status: Secondary | ICD-10-CM | POA: Insufficient documentation

## 2015-05-06 DIAGNOSIS — S01511A Laceration without foreign body of lip, initial encounter: Secondary | ICD-10-CM | POA: Diagnosis not present

## 2015-05-06 DIAGNOSIS — S0993XA Unspecified injury of face, initial encounter: Secondary | ICD-10-CM | POA: Diagnosis present

## 2015-05-06 DIAGNOSIS — M797 Fibromyalgia: Secondary | ICD-10-CM | POA: Diagnosis not present

## 2015-05-06 DIAGNOSIS — Z8709 Personal history of other diseases of the respiratory system: Secondary | ICD-10-CM | POA: Diagnosis not present

## 2015-05-06 DIAGNOSIS — Y9384 Activity, sleeping: Secondary | ICD-10-CM | POA: Insufficient documentation

## 2015-05-06 DIAGNOSIS — Z23 Encounter for immunization: Secondary | ICD-10-CM | POA: Diagnosis not present

## 2015-05-06 DIAGNOSIS — W01198A Fall on same level from slipping, tripping and stumbling with subsequent striking against other object, initial encounter: Secondary | ICD-10-CM | POA: Insufficient documentation

## 2015-05-06 DIAGNOSIS — S025XXA Fracture of tooth (traumatic), initial encounter for closed fracture: Secondary | ICD-10-CM | POA: Diagnosis not present

## 2015-05-06 DIAGNOSIS — Z720 Tobacco use: Secondary | ICD-10-CM | POA: Diagnosis not present

## 2015-05-06 DIAGNOSIS — G43909 Migraine, unspecified, not intractable, without status migrainosus: Secondary | ICD-10-CM | POA: Diagnosis not present

## 2015-05-06 DIAGNOSIS — Z79899 Other long term (current) drug therapy: Secondary | ICD-10-CM | POA: Insufficient documentation

## 2015-05-06 DIAGNOSIS — G8929 Other chronic pain: Secondary | ICD-10-CM | POA: Diagnosis not present

## 2015-05-06 DIAGNOSIS — Y9289 Other specified places as the place of occurrence of the external cause: Secondary | ICD-10-CM | POA: Insufficient documentation

## 2015-05-06 MED ORDER — TETANUS-DIPHTH-ACELL PERTUSSIS 5-2.5-18.5 LF-MCG/0.5 IM SUSP
0.5000 mL | Freq: Once | INTRAMUSCULAR | Status: AC
  Administered 2015-05-06: 0.5 mL via INTRAMUSCULAR
  Filled 2015-05-06: qty 0.5

## 2015-05-06 MED ORDER — OXYCODONE-ACETAMINOPHEN 5-325 MG PO TABS
1.0000 | ORAL_TABLET | Freq: Once | ORAL | Status: AC
Start: 2015-05-06 — End: 2015-05-06
  Administered 2015-05-06: 1 via ORAL
  Filled 2015-05-06 (×2): qty 1

## 2015-05-06 MED ORDER — OXYCODONE-ACETAMINOPHEN 5-325 MG PO TABS: 1.0000 | ORAL_TABLET | Freq: Four times a day (QID) | ORAL | Status: DC | PRN

## 2015-05-06 MED ORDER — LIDOCAINE-EPINEPHRINE (PF) 1 %-1:200000 IJ SOLN
20.0000 mL | Freq: Once | INTRAMUSCULAR | Status: DC
  Filled 2015-05-06: qty 20

## 2015-05-06 NOTE — ED Notes (Signed)
Pt states she was asleep on the couch when her dog kicked her causing her to fall into the coffee table.  Pt has avulsion to upper lip, a chipped tooth and bruising.
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
## 2015-05-06 NOTE — Discharge Instructions (Signed)
You were seen today for an injury to her face. You had a laceration repaired with absorbable sutures.  Keep this clean and it should heal.  You were also noted to have a dental fracture. You need follow-up with your dentist for definitive management. See return precautions below.  Dental Fracture You have a dental fracture or injury. This can mean the tooth is loose, has a chip in the enamel or is broken. If just the outer enamel is chipped, there is a good chance the tooth will not become infected. The only treatment needed may be to smooth off a rough edge. Fractures into the deeper layers (dentin and pulp) cause greater pain and are more likely to become infected. These require you to see a dentist as soon as possible to save the tooth. Loose teeth may need to be wired or bonded with a plastic splint to hold them in place. A paste may be painted on the open area of the broken tooth to reduce the pain. Antibiotics and pain medicine may be prescribed. Choosing a soft or liquid diet and rinsing the mouth out with warm water after meals may be helpful. See your dentist as recommended. Failure to seek care or follow up with a dentist or other specialist as recommended could result in the loss of your tooth, infection, or permanent dental problems. SEEK MEDICAL CARE IF:   You have increased pain not controlled with medicines.  You have swelling around the tooth, in the face or neck.  You have bleeding which starts, continues, or gets worse.  You have a fever. Document Released: 10/11/2004 Document Revised: 11/26/2011 Document Reviewed: 07/26/2009 [facility] Patient Information 2015 [place], [place]. This information is not intended to replace advice given to you by your health care provider. Make sure you discuss any questions you have with your health care provider.  Absorbable Suture Repair Absorbable sutures (stitches) hold skin together so you can heal. Keep skin wounds clean and dry for the next 2  to 3 days. Then, you may gently wash your wound and dress it with an antibiotic ointment as recommended. As your wound begins to heal, the sutures are no longer needed, and they typically begin to fall off. This will take 7 to 10 days. After 10 days, if your sutures are loose, you can remove them by wiping with a clean gauze pad or a cotton ball. Do not pull your sutures out. They should wipe away easily. If after 10 days they do not easily wipe away, have your caregiver take them out. Absorbable sutures may be used deep in a wound to help hold it together. If these stitches are below the skin, the body will absorb them completely in 3 to 4 weeks.  You may need a tetanus shot if:  You cannot remember when you had your last tetanus shot.  You have never had a tetanus shot. If you get a tetanus shot, your arm may swell, get red, and feel warm to the touch. This is common and not a problem. If you need a tetanus shot and you choose not to have one, there is a rare chance of getting tetanus. Sickness from tetanus can be serious. SEEK IMMEDIATE MEDICAL CARE IF:  You have redness in the wound area.  The wound area feels hot to the touch.  You develop swelling in the wound area.  You develop pain.  There is fluid drainage from the wound. Document Released: 10/11/2004 Document Revised: 11/26/2011 Document Reviewed: 01/23/2011 [employer]  Patient Information ©2015 [employer]. This information is not intended to replace advice given to you by your health care provider. Make sure you discuss any questions you have with your health care provider. ° °

## 2015-05-08 ENCOUNTER — Encounter (HOSPITAL_COMMUNITY): Payer: Self-pay | Admitting: *Deleted

## 2015-05-08 ENCOUNTER — Emergency Department (HOSPITAL_COMMUNITY)
Admission: EM | Admit: 2015-05-08 | Discharge: 2015-05-09 | Disposition: A | Discharge status: Home / Self Care | Payer: Medicaid Other | Attending: Emergency Medicine | Admitting: Emergency Medicine

## 2015-05-08 DIAGNOSIS — Z791 Long term (current) use of non-steroidal anti-inflammatories (NSAID): Secondary | ICD-10-CM | POA: Diagnosis not present

## 2015-05-08 DIAGNOSIS — Z8709 Personal history of other diseases of the respiratory system: Secondary | ICD-10-CM | POA: Diagnosis not present

## 2015-05-08 DIAGNOSIS — K088 Other specified disorders of teeth and supporting structures: Secondary | ICD-10-CM | POA: Diagnosis not present

## 2015-05-08 DIAGNOSIS — Z8679 Personal history of other diseases of the circulatory system: Secondary | ICD-10-CM | POA: Diagnosis not present

## 2015-05-08 DIAGNOSIS — K0889 Other specified disorders of teeth and supporting structures: Secondary | ICD-10-CM

## 2015-05-08 DIAGNOSIS — Z79899 Other long term (current) drug therapy: Secondary | ICD-10-CM | POA: Diagnosis not present

## 2015-05-08 DIAGNOSIS — G8929 Other chronic pain: Secondary | ICD-10-CM | POA: Diagnosis not present

## 2015-05-08 DIAGNOSIS — Z8739 Personal history of other diseases of the musculoskeletal system and connective tissue: Secondary | ICD-10-CM | POA: Diagnosis not present

## 2015-05-08 DIAGNOSIS — Z720 Tobacco use: Secondary | ICD-10-CM | POA: Diagnosis not present

## 2015-05-08 MED ORDER — KETOROLAC TROMETHAMINE 60 MG/2ML IM SOLN
60.0000 mg | Freq: Once | INTRAMUSCULAR | Status: AC
  Administered 2015-05-09: 60 mg via INTRAMUSCULAR
  Filled 2015-05-08: qty 2
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
## 2015-05-08 NOTE — ED Notes (Signed)
Pt c/o severe pain to lip and tooth; pt has stitches to upper lip and is unable to eat; pt has chipped front tooth and is c/o severe pain

## 2015-05-09 NOTE — Discharge Instructions (Signed)
You have absorbable sutures and they will not need to be removed. Continue to take ibuprofen 600-800 mg every 6 hours as needed for pain. You may also take Tylenol 1000 mg every 6 hours as needed for pain as well as frequent use of ice on the lip.   Dental Pain A tooth ache may be caused by cavities (tooth decay). Cavities expose the nerve of the tooth to air and hot or cold temperatures. It may come from an infection or abscess (also called a boil or furuncle) around your tooth. It is also often caused by dental caries (tooth decay). This causes the pain you are having. DIAGNOSIS  Your caregiver can diagnose this problem by exam. TREATMENT   If caused by an infection, it may be treated with medications which kill germs (antibiotics) and pain medications as prescribed by your caregiver. Take medications as directed.  Only take over-the-counter or prescription medicines for pain, discomfort, or fever as directed by your caregiver.  Whether the tooth ache today is caused by infection or dental disease, you should see your dentist as soon as possible for further care. SEEK MEDICAL CARE IF: The exam and treatment you received today has been provided on an emergency basis only. This is not a substitute for complete medical or dental care. If your problem worsens or new problems (symptoms) appear, and you are unable to meet with your dentist, call or return to this location. SEEK IMMEDIATE MEDICAL CARE IF:   You have a fever.  You develop redness and swelling of your face, jaw, or neck.  You are unable to open your mouth.  You have severe pain uncontrolled by pain medicine. MAKE SURE YOU:   Understand these instructions.  Will watch your condition.  Will get help right away if you are not doing well or get worse. Document Released: 09/03/2005 Document Revised: 11/26/2011 Document Reviewed: 04/21/2008 [facility] Patient Information 2015 [place], [place]. This information is not intended to  replace advice given to you by your health care provider. Make sure you discuss any questions you have with your health care provider.  Dental Pain A tooth ache may be caused by cavities (tooth decay). Cavities expose the nerve of the tooth to air and hot or cold temperatures. It may come from an infection or abscess (also called a boil or furuncle) around your tooth. It is also often caused by dental caries (tooth decay). This causes the pain you are having. DIAGNOSIS  Your caregiver can diagnose this problem by exam. TREATMENT   If caused by an infection, it may be treated with medications which kill germs (antibiotics) and pain medications as prescribed by your caregiver. Take medications as directed.  Only take over-the-counter or prescription medicines for pain, discomfort, or fever as directed by your caregiver.  Whether the tooth ache today is caused by infection or dental disease, you should see your dentist as soon as possible for further care. SEEK MEDICAL CARE IF: The exam and treatment you received today has been provided on an emergency basis only. This is not a substitute for complete medical or dental care. If your problem worsens or new problems (symptoms) appear, and you are unable to meet with your dentist, call or return to this location. SEEK IMMEDIATE MEDICAL CARE IF:   You have a fever.  You develop redness and swelling of your face, jaw, or neck.  You are unable to open your mouth.  You have severe pain uncontrolled by pain medicine. MAKE SURE YOU:  Understand these instructions.  Will watch your condition.  Will get help right away if you are not doing well or get worse. Document Released: 09/03/2005 Document Revised: 11/26/2011 Document Reviewed: 04/21/2008 [facility] Patient Information 2015 [place], [place]. This information is not intended to replace advice given to you by your health care provider. Make sure you discuss any questions you have with your  health care provider. Facial Laceration  A facial laceration is a cut on the face. These injuries can be painful and cause bleeding. Lacerations usually heal quickly, but they need special care to reduce scarring. DIAGNOSIS  Your health care provider will take a medical history, ask for details about how the injury occurred, and examine the wound to determine how deep the cut is. TREATMENT  Some facial lacerations may not require closure. Others may not be able to be closed because of an increased risk of infection. The risk of infection and the chance for successful closure will depend on various factors, including the amount of time since the injury occurred. The wound may be cleaned to help prevent infection. If closure is appropriate, pain medicines may be given if needed. Your health care provider will use stitches (sutures), wound glue (adhesive), or skin adhesive strips to repair the laceration. These tools bring the skin edges together to allow for faster healing and a better cosmetic outcome. If needed, you may also be given a tetanus shot. HOME CARE INSTRUCTIONS  Only take over-the-counter or prescription medicines as directed by your health care provider.  Follow your health care provider's instructions for wound care. These instructions will vary depending on the technique used for closing the wound. For Sutures:  Keep the wound clean and dry.   If you were given a bandage (dressing), you should change it at least once a day. Also change the dressing if it becomes wet or dirty, or as directed by your health care provider.   Wash the wound with soap and water 2 times a day. Rinse the wound off with water to remove all soap. Pat the wound dry with a clean towel.   After cleaning, apply a thin layer of the antibiotic ointment recommended by your health care provider. This will help prevent infection and keep the dressing from sticking.   You may shower as usual after the first 24  hours. Do not soak the wound in water until the sutures are removed.   Get your sutures removed as directed by your health care provider. With facial lacerations, sutures should usually be taken out after 4-5 days to avoid stitch marks.   Wait a few days after your sutures are removed before applying any makeup. For Skin Adhesive Strips:  Keep the wound clean and dry.   Do not get the skin adhesive strips wet. You may bathe carefully, using caution to keep the wound dry.   If the wound gets wet, pat it dry with a clean towel.   Skin adhesive strips will fall off on their own. You may trim the strips as the wound heals. Do not remove skin adhesive strips that are still stuck to the wound. They will fall off in time.  For Wound Adhesive:  You may briefly wet your wound in the shower or bath. Do not soak or scrub the wound. Do not swim. Avoid periods of heavy sweating until the skin adhesive has fallen off on its own. After showering or bathing, gently pat the wound dry with a clean towel.   Do  not apply liquid medicine, cream medicine, ointment medicine, or makeup to your wound while the skin adhesive is in place. This may loosen the film before your wound is healed.   If a dressing is placed over the wound, be careful not to apply tape directly over the skin adhesive. This may cause the adhesive to be pulled off before the wound is healed.   Avoid prolonged exposure to sunlight or tanning lamps while the skin adhesive is in place.  The skin adhesive will usually remain in place for 5-10 days, then naturally fall off the skin. Do not pick at the adhesive film.  After Healing: Once the wound has healed, cover the wound with sunscreen during the day for 1 full year. This can help minimize scarring. Exposure to ultraviolet light in the first year will darken the scar. It can take 1-2 years for the scar to lose its redness and to heal completely.  SEEK IMMEDIATE MEDICAL CARE IF:  You  have redness, pain, or swelling around the wound.   You see ayellowish-white fluid (pus) coming from the wound.   You have chills or a fever.  MAKE SURE YOU:  Understand these instructions.  Will watch your condition.  Will get help right away if you are not doing well or get worse. Document Released: 10/11/2004 Document Revised: 06/24/2013 Document Reviewed: 04/16/2013 [facility] Patient Information 2015 [place], [place]. This information is not intended to replace advice given to you by your health care provider. Make sure you discuss any questions you have with your health care provider.

## 2015-09-16 ENCOUNTER — Emergency Department (HOSPITAL_COMMUNITY)
Admission: EM | Admit: 2015-09-16 | Discharge: 2015-09-16 | Disposition: A | Discharge status: Home / Self Care | Payer: Medicaid Other | Attending: Emergency Medicine | Admitting: Emergency Medicine

## 2015-09-16 ENCOUNTER — Encounter (HOSPITAL_COMMUNITY): Payer: Self-pay | Admitting: *Deleted

## 2015-09-16 DIAGNOSIS — F17210 Nicotine dependence, cigarettes, uncomplicated: Secondary | ICD-10-CM | POA: Diagnosis not present

## 2015-09-16 DIAGNOSIS — Z791 Long term (current) use of non-steroidal anti-inflammatories (NSAID): Secondary | ICD-10-CM | POA: Diagnosis not present

## 2015-09-16 DIAGNOSIS — Z79899 Other long term (current) drug therapy: Secondary | ICD-10-CM | POA: Insufficient documentation

## 2015-09-16 DIAGNOSIS — Z79891 Long term (current) use of opiate analgesic: Secondary | ICD-10-CM | POA: Insufficient documentation

## 2015-09-16 DIAGNOSIS — M5416 Radiculopathy, lumbar region: Secondary | ICD-10-CM

## 2015-09-16 DIAGNOSIS — G8929 Other chronic pain: Secondary | ICD-10-CM | POA: Diagnosis not present

## 2015-09-16 DIAGNOSIS — M545 Low back pain: Secondary | ICD-10-CM | POA: Diagnosis present

## 2015-09-16 DIAGNOSIS — M797 Fibromyalgia: Secondary | ICD-10-CM | POA: Insufficient documentation

## 2015-09-16 DIAGNOSIS — Z8679 Personal history of other diseases of the circulatory system: Secondary | ICD-10-CM | POA: Diagnosis not present

## 2015-09-16 MED ORDER — HYDROCODONE-ACETAMINOPHEN 5-325 MG PO TABS: 2.0000 | ORAL_TABLET | ORAL | Status: DC | PRN

## 2015-09-16 MED ORDER — DEXAMETHASONE SODIUM PHOSPHATE 10 MG/ML IJ SOLN
10.0000 mg | Freq: Once | INTRAMUSCULAR | Status: AC
  Administered 2015-09-16: 10 mg via INTRAMUSCULAR
  Filled 2015-09-16: qty 1

## 2015-09-16 NOTE — Discharge Instructions (Signed)
Lumbosacral Radiculopathy °Lumbosacral radiculopathy is a condition that involves the spinal nerves and nerve roots in the low back and bottom of the spine. The condition develops when these nerves and nerve roots move out of place or become inflamed and cause symptoms. °CAUSES °This condition may be caused by: °· Pressure from a disk that bulges out of place (herniated disk). A disk is a plate of cartilage that separates bones in the spine. °· Disk degeneration. °· A narrowing of the bones of the lower back (spinal stenosis). °· A tumor. °· An infection. °· An injury that places sudden pressure on the disks that cushion the bones of your lower spine. °RISK FACTORS °This condition is more likely to develop in: °· Males aged 30-50 years. °· Females aged 50-60 years. °· People who lift improperly. °· People who are overweight or live a sedentary lifestyle. °· People who smoke. °· People who perform repetitive activities that strain the spine. °SYMPTOMS °Symptoms of this condition include: °· Pain that goes down from the back into the legs (sciatica). This is the most common symptom. The pain may be worse with sitting, coughing, or sneezing. °· Pain and numbness in the arms and legs. °· Muscle weakness. °· Tingling. °· Loss of bladder control or bowel control. °DIAGNOSIS °This condition is diagnosed with a physical exam and medical history. If the pain is lasting, you may have tests, such as: °· MRI scan. °· X-ray. °· CT scan. °· Myelogram. °· Nerve conduction study. °TREATMENT °This condition is often treated with: °· Hot packs and ice applied to affected areas. °· Stretches to improve flexibility. °· Exercises to strengthen back muscles. °· Physical therapy. °· Pain medicine. °· A steroid injection in the spine. °In some cases, no treatment is needed. If the condition is long-lasting (chronic), or if symptoms are severe, treatment may involve surgery or lifestyle changes, such as following a weight loss plan. °HOME  CARE INSTRUCTIONS °Medicines °· Take medicines only as directed by your health care provider. °· Do not drive or operate heavy machinery while taking pain medicine. °Injury Care °· Apply a heat pack to the injured area as directed by your health care provider. °· Apply ice to the affected area: °¨ Put ice in a plastic bag. °¨ Place a towel between your skin and the bag. °¨ Leave the ice on for 20-30 minutes, every 2 hours while you are awake or as needed. Or, leave the ice on for as long as directed by your health care provider. °Other Instructions °· If you were shown how to do any exercises or stretches, do them as directed by your health care provider. °· If your health care provider prescribed a diet or exercise program, follow it as directed. °· Keep all follow-up visits as directed by your health care provider. This is important. °SEEK MEDICAL CARE IF: °· Your pain does not improve over time even when taking pain medicines. °SEEK IMMEDIATE MEDICAL CARE IF: °· Your develop severe pain. °· Your pain suddenly gets worse. °· You develop increasing weakness in your legs. °· You lose the ability to control your bladder or bowel. °· You have difficulty walking or balancing. °· You have a fever. °  °This information is not intended to replace advice given to you by your health care provider. Make sure you discuss any questions you have with your health care provider. °  °Document Released: 09/03/2005 Document Revised: 01/18/2015 Document Reviewed: 08/30/2014 °Elsevier Interactive Patient Education ©2016 [employer] Inc[employer] ° °
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
## 2015-09-16 NOTE — ED Notes (Signed)
Pt states she fell the night before the night before the last. Pt states she fell on her back. Pt c/o lower back pain. [facility] and was told she was going to have to have surgery.

## 2015-10-18 ENCOUNTER — Emergency Department (HOSPITAL_COMMUNITY)
Admission: EM | Admit: 2015-10-18 | Discharge: 2015-10-18 | Disposition: A | Discharge status: Home / Self Care | Payer: Medicaid Other | Attending: Emergency Medicine | Admitting: Emergency Medicine

## 2015-10-18 ENCOUNTER — Encounter (HOSPITAL_COMMUNITY): Payer: Self-pay | Admitting: Emergency Medicine

## 2015-10-18 DIAGNOSIS — S40011A Contusion of right shoulder, initial encounter: Secondary | ICD-10-CM | POA: Diagnosis not present

## 2015-10-18 DIAGNOSIS — G8929 Other chronic pain: Secondary | ICD-10-CM | POA: Insufficient documentation

## 2015-10-18 DIAGNOSIS — Y998 Other external cause status: Secondary | ICD-10-CM | POA: Insufficient documentation

## 2015-10-18 DIAGNOSIS — Z791 Long term (current) use of non-steroidal anti-inflammatories (NSAID): Secondary | ICD-10-CM | POA: Insufficient documentation

## 2015-10-18 DIAGNOSIS — Y9289 Other specified places as the place of occurrence of the external cause: Secondary | ICD-10-CM | POA: Diagnosis not present

## 2015-10-18 DIAGNOSIS — Z79899 Other long term (current) drug therapy: Secondary | ICD-10-CM | POA: Insufficient documentation

## 2015-10-18 DIAGNOSIS — S3992XA Unspecified injury of lower back, initial encounter: Secondary | ICD-10-CM | POA: Diagnosis present

## 2015-10-18 DIAGNOSIS — S39012A Strain of muscle, fascia and tendon of lower back, initial encounter: Secondary | ICD-10-CM | POA: Insufficient documentation

## 2015-10-18 DIAGNOSIS — Y9389 Activity, other specified: Secondary | ICD-10-CM | POA: Insufficient documentation

## 2015-10-18 DIAGNOSIS — S0990XA Unspecified injury of head, initial encounter: Secondary | ICD-10-CM | POA: Insufficient documentation

## 2015-10-18 DIAGNOSIS — M797 Fibromyalgia: Secondary | ICD-10-CM | POA: Diagnosis not present

## 2015-10-18 DIAGNOSIS — W108XXA Fall (on) (from) other stairs and steps, initial encounter: Secondary | ICD-10-CM | POA: Diagnosis not present

## 2015-10-18 DIAGNOSIS — Z8679 Personal history of other diseases of the circulatory system: Secondary | ICD-10-CM | POA: Insufficient documentation

## 2015-10-18 DIAGNOSIS — Z8709 Personal history of other diseases of the respiratory system: Secondary | ICD-10-CM | POA: Diagnosis not present

## 2015-10-18 DIAGNOSIS — F17210 Nicotine dependence, cigarettes, uncomplicated: Secondary | ICD-10-CM | POA: Diagnosis not present

## 2015-10-18 MED ORDER — IBUPROFEN 800 MG PO TABS
800.0000 mg | ORAL_TABLET | Freq: Once | ORAL | Status: AC
  Administered 2015-10-18: 800 mg via ORAL
  Filled 2015-10-18: qty 1

## 2015-10-18 MED ORDER — ACETAMINOPHEN 325 MG PO TABS
650.0000 mg | ORAL_TABLET | Freq: Once | ORAL | Status: AC
  Administered 2015-10-18: 650 mg via ORAL
  Filled 2015-10-18: qty 2

## 2015-10-18 MED ORDER — TRAMADOL HCL 50 MG PO TABS
50.0000 mg | ORAL_TABLET | Freq: Once | ORAL | Status: AC
  Administered 2015-10-18: 50 mg via ORAL
  Filled 2015-10-18: qty 1

## 2015-10-18 NOTE — Discharge Instructions (Signed)
Your exam is consistent with contusion of the right shoulder and strain of the lower back. Please use tylenol and ibuprofen every 6 hours for soreness. See Dr Olena Leatherwood for additional evaluation and management . Lumbosacral Strain Lumbosacral strain is a strain of any of the parts that make up your lumbosacral vertebrae. Your lumbosacral vertebrae are the bones that make up the lower third of your backbone. Your lumbosacral vertebrae are held together by muscles and tough, fibrous tissue (ligaments).  CAUSES  A sudden blow to your back can cause lumbosacral strain. Also, anything that causes an excessive stretch of the muscles in the low back can cause this strain. This is typically seen when people exert themselves strenuously, fall, lift heavy objects, bend, or crouch repeatedly. RISK FACTORS  Physically demanding work.  Participation in pushing or pulling sports or sports that require a sudden twist of the back (tennis, golf, baseball).  Weight lifting.  Excessive lower back curvature.  Forward-tilted pelvis.  Weak back or abdominal muscles or both.  Tight hamstrings. SIGNS AND SYMPTOMS  Lumbosacral strain may cause pain in the area of your injury or pain that moves (radiates) down your leg.  DIAGNOSIS Your health care provider can often diagnose lumbosacral strain through a physical exam. In some cases, you may need tests such as X-ray exams.  TREATMENT  Treatment for your lower back injury depends on many factors that your clinician will have to evaluate. However, most treatment will include the use of anti-inflammatory medicines. HOME CARE INSTRUCTIONS   Avoid hard physical activities (tennis, racquetball, waterskiing) if you are not in proper physical condition for it. This may aggravate or create problems.  If you have a back problem, avoid sports requiring sudden body movements. Swimming and walking are generally safer activities.  Maintain good posture.  Maintain a healthy  weight.  For acute conditions, you may put ice on the injured area.  Put ice in a plastic bag.  Place a towel between your skin and the bag.  Leave the ice on for 20 minutes, 2-3 times a day.  When the low back starts healing, stretching and strengthening exercises may be recommended. SEEK MEDICAL CARE IF:  Your back pain is getting worse.  You experience severe back pain not relieved with medicines. SEEK IMMEDIATE MEDICAL CARE IF:   You have numbness, tingling, weakness, or problems with the use of your arms or legs.  There is a change in bowel or bladder control.  You have increasing pain in any area of the body, including your belly (abdomen).  You notice shortness of breath, dizziness, or feel faint.  You feel sick to your stomach (nauseous), are throwing up (vomiting), or become sweaty.  You notice discoloration of your toes or legs, or your feet get very cold. MAKE SURE YOU:   Understand these instructions.  Will watch your condition.  Will get help right away if you are not doing well or get worse.   This information is not intended to replace advice given to you by your health care provider. Make sure you discuss any questions you have with your health care provider.   Document Released: 06/13/2005 Document Revised: 09/24/2014 Document Reviewed: 04/22/2013 Elsevier Interactive Patient Education 2016 [employer]  Contusion A contusion is a deep bruise. Contusions happen when an injury causes bleeding under the skin. Symptoms of bruising include pain, swelling, and discolored skin. The skin may turn blue, purple, or yellow. HOME CARE   Rest the injured area.  If told,  put ice on the injured area.  Put ice in a plastic bag.  Place a towel between your skin and the bag.  Leave the ice on for 20 minutes, 2-3 times per day.  If told, put light pressure (compression) on the injured area using an elastic bandage. Make sure the bandage is not too tight.  Remove it and put it back on as told by your doctor.  If possible, raise (elevate) the injured area above the level of your heart while you are sitting or lying down.  Take over-the-counter and prescription medicines only as told by your doctor. GET HELP IF:  Your symptoms do not get better after several days of treatment.  Your symptoms get worse.  You have trouble moving the injured area. GET HELP RIGHT AWAY IF:   You have very bad pain.  You have a loss of feeling (numbness) in a hand or foot.  Your hand or foot turns pale or cold.   This information is not intended to replace advice given to you by your health care provider. Make sure you discuss any questions you have with your health care provider.   Document Released: 02/20/2008 Document Revised: 05/25/2015 Document Reviewed: 01/19/2015 Elsevier Interactive Patient Education [employer]
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
## 2015-10-18 NOTE — ED Notes (Signed)
PT c/o chronic lower back pain and stated she took her dog outside this evening and fell down a few outdoor steps about 25 min prior to ED arrival. PT ambulatory in triage and denies taking any medications but is drowsy and hard to keep attention.

## 2016-01-15 ENCOUNTER — Encounter (HOSPITAL_COMMUNITY): Payer: Self-pay

## 2016-01-15 ENCOUNTER — Emergency Department (HOSPITAL_COMMUNITY): Payer: No Typology Code available for payment source

## 2016-01-15 ENCOUNTER — Emergency Department (HOSPITAL_COMMUNITY)
Admission: EM | Admit: 2016-01-15 | Discharge: 2016-01-15 | Disposition: A | Discharge status: Home / Self Care | Payer: No Typology Code available for payment source | Attending: Emergency Medicine | Admitting: Emergency Medicine

## 2016-01-15 DIAGNOSIS — M25562 Pain in left knee: Secondary | ICD-10-CM | POA: Insufficient documentation

## 2016-01-15 DIAGNOSIS — Y939 Activity, unspecified: Secondary | ICD-10-CM | POA: Insufficient documentation

## 2016-01-15 DIAGNOSIS — M25561 Pain in right knee: Secondary | ICD-10-CM | POA: Diagnosis not present

## 2016-01-15 DIAGNOSIS — S39012A Strain of muscle, fascia and tendon of lower back, initial encounter: Secondary | ICD-10-CM | POA: Diagnosis not present

## 2016-01-15 DIAGNOSIS — F17210 Nicotine dependence, cigarettes, uncomplicated: Secondary | ICD-10-CM | POA: Diagnosis not present

## 2016-01-15 DIAGNOSIS — Y999 Unspecified external cause status: Secondary | ICD-10-CM | POA: Insufficient documentation

## 2016-01-15 DIAGNOSIS — S3992XA Unspecified injury of lower back, initial encounter: Secondary | ICD-10-CM | POA: Diagnosis present

## 2016-01-15 DIAGNOSIS — M542 Cervicalgia: Secondary | ICD-10-CM | POA: Diagnosis not present

## 2016-01-15 DIAGNOSIS — Y929 Unspecified place or not applicable: Secondary | ICD-10-CM | POA: Diagnosis not present

## 2016-01-15 MED ORDER — OXYCODONE-ACETAMINOPHEN 5-325 MG PO TABS
1.0000 | ORAL_TABLET | Freq: Once | ORAL | Status: AC
  Administered 2016-01-15: 1 via ORAL
  Filled 2016-01-15: qty 1

## 2016-01-15 MED ORDER — IBUPROFEN 400 MG PO TABS
600.0000 mg | ORAL_TABLET | Freq: Once | ORAL | Status: AC
  Administered 2016-01-15: 600 mg via ORAL
  Filled 2016-01-15: qty 2
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
## 2016-02-03 ENCOUNTER — Emergency Department (HOSPITAL_COMMUNITY)
Admission: EM | Admit: 2016-02-03 | Discharge: 2016-02-04 | Disposition: A | Discharge status: Home / Self Care | Payer: Medicaid Other | Attending: Emergency Medicine | Admitting: Emergency Medicine

## 2016-02-03 ENCOUNTER — Emergency Department (HOSPITAL_COMMUNITY): Payer: Medicaid Other

## 2016-02-03 ENCOUNTER — Encounter (HOSPITAL_COMMUNITY): Payer: Self-pay | Admitting: Emergency Medicine

## 2016-02-03 DIAGNOSIS — Y939 Activity, unspecified: Secondary | ICD-10-CM | POA: Diagnosis not present

## 2016-02-03 DIAGNOSIS — S300XXA Contusion of lower back and pelvis, initial encounter: Secondary | ICD-10-CM | POA: Diagnosis not present

## 2016-02-03 DIAGNOSIS — Y999 Unspecified external cause status: Secondary | ICD-10-CM | POA: Diagnosis not present

## 2016-02-03 DIAGNOSIS — W19XXXA Unspecified fall, initial encounter: Secondary | ICD-10-CM

## 2016-02-03 DIAGNOSIS — S3992XA Unspecified injury of lower back, initial encounter: Secondary | ICD-10-CM | POA: Diagnosis present

## 2016-02-03 DIAGNOSIS — Y929 Unspecified place or not applicable: Secondary | ICD-10-CM | POA: Insufficient documentation

## 2016-02-03 DIAGNOSIS — F17210 Nicotine dependence, cigarettes, uncomplicated: Secondary | ICD-10-CM | POA: Diagnosis not present

## 2016-02-03 DIAGNOSIS — W109XXA Fall (on) (from) unspecified stairs and steps, initial encounter: Secondary | ICD-10-CM | POA: Diagnosis not present

## 2016-02-03 LAB — POC URINE PREG, ED: PREG TEST UR: NEGATIVE

## 2016-02-03 MED ORDER — HYDROCODONE-ACETAMINOPHEN 5-325 MG PO TABS
1.0000 | ORAL_TABLET | Freq: Once | ORAL | Status: AC
  Administered 2016-02-03: 1 via ORAL
  Filled 2016-02-03: qty 1

## 2016-02-03 NOTE — ED Notes (Signed)
Pt requesting to "have my blood drawn for a drug screen to have on record".  Informed pt that we normally only do urine drug tests when we have cause to know if there are drugs in a persons system and that it would be at the doctors discretion as to whether or not a drug screen is performed on her visit today.

## 2016-02-03 NOTE — ED Notes (Signed)
Pt states that she slid down ~ 6 steps and now having lower back pain

## 2016-02-04 LAB — URINALYSIS, ROUTINE W REFLEX MICROSCOPIC
BILIRUBIN URINE: NEGATIVE
GLUCOSE, UA: NEGATIVE mg/dL
KETONES UR: NEGATIVE mg/dL
LEUKOCYTES UA: NEGATIVE
Nitrite: NEGATIVE
Specific Gravity, Urine: >1.03 — ABNORMAL HIGH (ref 1.005–1.030)
pH: 6 (ref 5.0–8.0)

## 2016-02-04 LAB — URINE MICROSCOPIC-ADD ON

## 2016-02-04 MED ORDER — HYDROCODONE-ACETAMINOPHEN 5-325 MG PO TABS
1.0000 | ORAL_TABLET | ORAL | Status: DC | PRN
Start: 2016-02-04 — End: 2019-01-13

## 2016-02-04 MED ORDER — HYDROCODONE-ACETAMINOPHEN 5-325 MG PO TABS
1.0000 | ORAL_TABLET | Freq: Once | ORAL | Status: DC
  Filled 2016-02-04: qty 1

## 2016-02-04 MED ORDER — HYDROCODONE-ACETAMINOPHEN 5-325 MG PO TABS
1.0000 | ORAL_TABLET | Freq: Once | ORAL | Status: AC
  Administered 2016-02-04: 1 via ORAL

## 2016-02-04 NOTE — Discharge Instructions (Signed)
Contusion A contusion is a deep bruise. Contusions are the result of a blunt injury to tissues and muscle fibers under the skin. The injury causes bleeding under the skin. The skin overlying the contusion may turn blue, purple, or yellow. Minor injuries will give you a painless contusion, but more severe contusions may stay painful and swollen for a few weeks.  CAUSES  This condition is usually caused by a blow, trauma, or direct force to an area of the body. SYMPTOMS  Symptoms of this condition include:  Swelling of the injured area.  Pain and tenderness in the injured area.  Discoloration. The area may have redness and then turn blue, purple, or yellow. DIAGNOSIS  This condition is diagnosed based on a physical exam and medical history. An X-ray, CT scan, or MRI may be needed to determine if there are any associated injuries, such as broken bones (fractures). TREATMENT  Specific treatment for this condition depends on what area of the body was injured. In general, the best treatment for a contusion is resting, icing, applying pressure to (compression), and elevating the injured area. This is often called the RICE strategy. Over-the-counter anti-inflammatory medicines may also be recommended for pain control.  HOME CARE INSTRUCTIONS   Rest the injured area.  If directed, apply ice to the injured area:  Put ice in a plastic bag.  Place a towel between your skin and the bag.  Leave the ice on for 20 minutes, 2-3 times per day.  If directed, apply light compression to the injured area using an elastic bandage. Make sure the bandage is not wrapped too tightly. Remove and reapply the bandage as directed by your health care provider.  If possible, raise (elevate) the injured area above the level of your heart while you are sitting or lying down.  Take over-the-counter and prescription medicines only as told by your health care provider. SEEK MEDICAL CARE IF:  Your symptoms do not  improve after several days of treatment.  Your symptoms get worse.  You have difficulty moving the injured area. SEEK IMMEDIATE MEDICAL CARE IF:   You have severe pain.  You have numbness in a hand or foot.  Your hand or foot turns pale or cold.   This information is not intended to replace advice given to you by your health care provider. Make sure you discuss any questions you have with your health care provider.   Document Released: 06/13/2005 Document Revised: 05/25/2015 Document Reviewed: 01/19/2015 Elsevier Interactive Patient Education 2016 [employer]    You may take the hydrocodone prescribed for pain relief.  This will make you drowsy - do not drive within 4 hours of taking this medication.
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
## 2016-02-04 NOTE — ED Notes (Signed)
Pt states understanding of care given and follow up instructions.  Pt states her father is driving her home since she is having pain medication.  Instructed not to drink ETOH or drive while taking hydrocodone

## 2016-02-10 ENCOUNTER — Other Ambulatory Visit: Payer: Self-pay | Admitting: Orthopedic Surgery

## 2016-02-10 DIAGNOSIS — M5136 Other intervertebral disc degeneration, lumbar region: Secondary | ICD-10-CM

## 2016-02-18 ENCOUNTER — Inpatient Hospital Stay: Admission: RE | Admit: 2016-02-18 | Payer: Medicaid Other | Source: Ambulatory Visit

## 2016-02-25 ENCOUNTER — Inpatient Hospital Stay: Admission: RE | Admit: 2016-02-25 | Payer: Medicaid Other | Source: Ambulatory Visit

## 2016-03-03 ENCOUNTER — Inpatient Hospital Stay: Admission: RE | Admit: 2016-03-03 | Payer: Medicaid Other | Source: Ambulatory Visit

## 2016-03-07 ENCOUNTER — Other Ambulatory Visit: Payer: Medicaid Other

## 2016-03-09 ENCOUNTER — Ambulatory Visit
Admission: RE | Admit: 2016-03-09 | Discharge: 2016-03-09 | Disposition: A | Discharge status: Home / Self Care | Payer: Medicaid Other | Source: Ambulatory Visit | Attending: Orthopedic Surgery | Admitting: Orthopedic Surgery

## 2016-03-09 DIAGNOSIS — M5136 Other intervertebral disc degeneration, lumbar region: Secondary | ICD-10-CM

## 2016-03-09 DIAGNOSIS — M51369 Other intervertebral disc degeneration, lumbar region without mention of lumbar back pain or lower extremity pain: Secondary | ICD-10-CM

## 2016-06-06 ENCOUNTER — Ambulatory Visit (HOSPITAL_COMMUNITY): Payer: Medicaid Other | Attending: Physician Assistant | Admitting: Physical Therapy

## 2016-06-06 ENCOUNTER — Encounter (HOSPITAL_COMMUNITY): Payer: Self-pay

## 2016-07-08 ENCOUNTER — Emergency Department (HOSPITAL_COMMUNITY): Payer: Medicaid Other

## 2016-07-08 ENCOUNTER — Encounter (HOSPITAL_COMMUNITY): Payer: Self-pay | Admitting: Emergency Medicine

## 2016-07-08 ENCOUNTER — Emergency Department (HOSPITAL_COMMUNITY)
Admission: EM | Admit: 2016-07-08 | Discharge: 2016-07-08 | Disposition: A | Discharge status: Home / Self Care | Payer: Medicaid Other | Attending: Emergency Medicine | Admitting: Emergency Medicine

## 2016-07-08 DIAGNOSIS — R197 Diarrhea, unspecified: Secondary | ICD-10-CM | POA: Insufficient documentation

## 2016-07-08 DIAGNOSIS — F17210 Nicotine dependence, cigarettes, uncomplicated: Secondary | ICD-10-CM | POA: Insufficient documentation

## 2016-07-08 DIAGNOSIS — Z791 Long term (current) use of non-steroidal anti-inflammatories (NSAID): Secondary | ICD-10-CM | POA: Insufficient documentation

## 2016-07-08 DIAGNOSIS — R1011 Right upper quadrant pain: Secondary | ICD-10-CM | POA: Diagnosis not present

## 2016-07-08 DIAGNOSIS — R1010 Upper abdominal pain, unspecified: Secondary | ICD-10-CM

## 2016-07-08 DIAGNOSIS — R112 Nausea with vomiting, unspecified: Secondary | ICD-10-CM | POA: Diagnosis not present

## 2016-07-08 LAB — COMPREHENSIVE METABOLIC PANEL
ALK PHOS: 62 U/L (ref 38–126)
ALT: 42 U/L (ref 14–54)
AST: 29 U/L (ref 15–41)
Albumin: 4.2 g/dL (ref 3.5–5.0)
Anion gap: 8 (ref 5–15)
BUN: 6 mg/dL (ref 6–20)
CALCIUM: 9.1 mg/dL (ref 8.9–10.3)
CHLORIDE: 108 mmol/L (ref 101–111)
CO2: 24 mmol/L (ref 22–32)
CREATININE: 0.59 mg/dL (ref 0.44–1.00)
Glucose, Bld: 115 mg/dL — ABNORMAL HIGH (ref 65–99)
Potassium: 3.4 mmol/L — ABNORMAL LOW (ref 3.5–5.1)
Sodium: 140 mmol/L (ref 135–145)
TOTAL PROTEIN: 7.3 g/dL (ref 6.5–8.1)
Total Bilirubin: 0.3 mg/dL (ref 0.3–1.2)

## 2016-07-08 LAB — URINALYSIS, ROUTINE W REFLEX MICROSCOPIC
Bilirubin Urine: NEGATIVE
Glucose, UA: NEGATIVE mg/dL
Ketones, ur: NEGATIVE mg/dL
LEUKOCYTES UA: NEGATIVE
NITRITE: NEGATIVE
Specific Gravity, Urine: 1.015 (ref 1.005–1.030)
pH: 8 (ref 5.0–8.0)

## 2016-07-08 LAB — CBC
HCT: 39.4 % (ref 36.0–46.0)
Hemoglobin: 13.1 g/dL (ref 12.0–15.0)
MCH: 27 pg (ref 26.0–34.0)
MCHC: 33.2 g/dL (ref 30.0–36.0)
MCV: 81.1 fL (ref 78.0–100.0)
PLATELETS: 437 10*3/uL — AB (ref 150–400)
RBC: 4.86 MIL/uL (ref 3.87–5.11)
RDW: 13.7 % (ref 11.5–15.5)
WBC: 11.5 10*3/uL — AB (ref 4.0–10.5)

## 2016-07-08 LAB — DIFFERENTIAL
BASOS PCT: 0 %
Basophils Absolute: 0 10*3/uL (ref 0.0–0.1)
EOS PCT: 0 %
Eosinophils Absolute: 0 10*3/uL (ref 0.0–0.7)
LYMPHS PCT: 12 %
Lymphs Abs: 1.4 10*3/uL (ref 0.7–4.0)
Monocytes Absolute: 0.4 10*3/uL (ref 0.1–1.0)
Monocytes Relative: 3 %
NEUTROS ABS: 9.8 10*3/uL — AB (ref 1.7–7.7)
NEUTROS PCT: 85 %

## 2016-07-08 LAB — I-STAT BETA HCG BLOOD, ED (MC, WL, AP ONLY)

## 2016-07-08 LAB — LIPASE, BLOOD: LIPASE: 16 U/L (ref 11–51)

## 2016-07-08 LAB — URINE MICROSCOPIC-ADD ON

## 2016-07-08 MED ORDER — ONDANSETRON HCL 4 MG/2ML IJ SOLN
4.0000 mg | Freq: Once | INTRAMUSCULAR | Status: AC
  Administered 2016-07-08: 4 mg via INTRAVENOUS
  Filled 2016-07-08: qty 2

## 2016-07-08 MED ORDER — IOPAMIDOL (ISOVUE-300) INJECTION 61%
100.0000 mL | Freq: Once | INTRAVENOUS | Status: AC | PRN
  Administered 2016-07-08: 100 mL via INTRAVENOUS

## 2016-07-08 MED ORDER — HYDROCODONE-ACETAMINOPHEN 5-325 MG PO TABS: 1.0000 | ORAL_TABLET | Freq: Four times a day (QID) | ORAL | 0 refills | Status: DC | PRN

## 2016-07-08 MED ORDER — ONDANSETRON HCL 4 MG PO TABS
4.0000 mg | ORAL_TABLET | Freq: Three times a day (TID) | ORAL | 0 refills | Status: AC | PRN
Start: 2016-07-08 — End: ?

## 2016-07-08 MED ORDER — METOCLOPRAMIDE HCL 5 MG/ML IJ SOLN
10.0000 mg | Freq: Once | INTRAMUSCULAR | Status: AC
  Administered 2016-07-08: 10 mg via INTRAVENOUS
  Filled 2016-07-08: qty 2

## 2016-07-08 MED ORDER — SODIUM CHLORIDE 0.9 % IV BOLUS (SEPSIS)
1000.0000 mL | Freq: Once | INTRAVENOUS | Status: AC
  Administered 2016-07-08: 1000 mL via INTRAVENOUS

## 2016-07-08 MED ORDER — MORPHINE SULFATE (PF) 2 MG/ML IV SOLN
4.0000 mg | Freq: Once | INTRAVENOUS | Status: AC
  Administered 2016-07-08: 4 mg via INTRAVENOUS
  Filled 2016-07-08 (×2): qty 2

## 2016-07-08 MED ORDER — MORPHINE SULFATE (PF) 2 MG/ML IV SOLN
4.0000 mg | Freq: Once | INTRAVENOUS | Status: AC
  Administered 2016-07-08: 4 mg via INTRAVENOUS
  Filled 2016-07-08: qty 2

## 2016-07-08 MED ORDER — IOPAMIDOL (ISOVUE-300) INJECTION 61%
INTRAVENOUS | Status: AC
  Administered 2016-07-08: 30 mL via ORAL
  Filled 2016-07-08: qty 30

## 2016-07-08 NOTE — ED Notes (Signed)
Pt reports pain and nausea. Verbal order from Dr Verdie MosherLiu to repeat morphine and to give Reglan 10 mg Iv for Nausea

## 2016-07-08 NOTE — ED Triage Notes (Signed)
Pt reports abdominal pain and n/v/d that started at 0100 today.

## 2016-07-08 NOTE — Discharge Instructions (Signed)
Your work-up today is reassuring.  Take medications as prescribed. Return for worsening symptoms, including escalating pain, fever, intractable vomiting or any other symptoms concerning to you. Follow-up with your primary care doctor closely.
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
## 2016-09-20 ENCOUNTER — Emergency Department (HOSPITAL_COMMUNITY): Payer: Self-pay

## 2016-09-20 ENCOUNTER — Observation Stay (HOSPITAL_COMMUNITY)
Admission: EM | Admit: 2016-09-20 | Discharge: 2016-09-21 | Disposition: A | Discharge status: Home / Self Care | Payer: Self-pay | Attending: Family Medicine | Admitting: Family Medicine

## 2016-09-20 ENCOUNTER — Encounter (HOSPITAL_COMMUNITY): Payer: Self-pay | Admitting: Emergency Medicine

## 2016-09-20 DIAGNOSIS — Z79899 Other long term (current) drug therapy: Secondary | ICD-10-CM | POA: Insufficient documentation

## 2016-09-20 DIAGNOSIS — R4182 Altered mental status, unspecified: Principal | ICD-10-CM | POA: Insufficient documentation

## 2016-09-20 DIAGNOSIS — J9692 Respiratory failure, unspecified with hypercapnia: Secondary | ICD-10-CM | POA: Diagnosis present

## 2016-09-20 DIAGNOSIS — F1910 Other psychoactive substance abuse, uncomplicated: Secondary | ICD-10-CM | POA: Diagnosis present

## 2016-09-20 DIAGNOSIS — Z720 Tobacco use: Secondary | ICD-10-CM | POA: Diagnosis present

## 2016-09-20 DIAGNOSIS — J9690 Respiratory failure, unspecified, unspecified whether with hypoxia or hypercapnia: Secondary | ICD-10-CM

## 2016-09-20 DIAGNOSIS — F17210 Nicotine dependence, cigarettes, uncomplicated: Secondary | ICD-10-CM | POA: Insufficient documentation

## 2016-09-20 DIAGNOSIS — E876 Hypokalemia: Secondary | ICD-10-CM | POA: Diagnosis present

## 2016-09-20 DIAGNOSIS — J9602 Acute respiratory failure with hypercapnia: Secondary | ICD-10-CM | POA: Insufficient documentation

## 2016-09-20 DIAGNOSIS — R410 Disorientation, unspecified: Secondary | ICD-10-CM

## 2016-09-20 LAB — MRSA PCR SCREENING: MRSA by PCR: NEGATIVE

## 2016-09-20 LAB — CBC WITH DIFFERENTIAL/PLATELET
BASOS PCT: 1 %
Basophils Absolute: 0.1 10*3/uL (ref 0.0–0.1)
EOS ABS: 0.2 10*3/uL (ref 0.0–0.7)
Eosinophils Relative: 2 %
HCT: 35.8 % — ABNORMAL LOW (ref 36.0–46.0)
Hemoglobin: 11.8 g/dL — ABNORMAL LOW (ref 12.0–15.0)
Lymphocytes Relative: 15 %
Lymphs Abs: 1.6 10*3/uL (ref 0.7–4.0)
MCH: 27.6 pg (ref 26.0–34.0)
MCHC: 33 g/dL (ref 30.0–36.0)
MCV: 83.6 fL (ref 78.0–100.0)
MONO ABS: 1 10*3/uL (ref 0.1–1.0)
MONOS PCT: 10 %
NEUTROS PCT: 72 %
Neutro Abs: 7.9 10*3/uL — ABNORMAL HIGH (ref 1.7–7.7)
PLATELETS: 331 10*3/uL (ref 150–400)
RBC: 4.28 MIL/uL (ref 3.87–5.11)
RDW: 13.8 % (ref 11.5–15.5)
WBC: 10.8 10*3/uL — ABNORMAL HIGH (ref 4.0–10.5)

## 2016-09-20 LAB — COMPREHENSIVE METABOLIC PANEL
ALK PHOS: 64 U/L (ref 38–126)
ALT: 33 U/L (ref 14–54)
ANION GAP: 8 (ref 5–15)
AST: 62 U/L — ABNORMAL HIGH (ref 15–41)
Albumin: 3.9 g/dL (ref 3.5–5.0)
BILIRUBIN TOTAL: 0.9 mg/dL (ref 0.3–1.2)
BUN: 17 mg/dL (ref 6–20)
CALCIUM: 8.6 mg/dL — AB (ref 8.9–10.3)
CO2: 23 mmol/L (ref 22–32)
CREATININE: 0.81 mg/dL (ref 0.44–1.00)
Chloride: 103 mmol/L (ref 101–111)
GFR calc non Af Amer: >60 mL/min (ref >60)
GLUCOSE: 107 mg/dL — AB (ref 65–99)
Potassium: 3 mmol/L — ABNORMAL LOW (ref 3.5–5.1)
SODIUM: 134 mmol/L — AB (ref 135–145)
TOTAL PROTEIN: 6.6 g/dL (ref 6.5–8.1)

## 2016-09-20 LAB — BLOOD GAS, ARTERIAL
BICARBONATE: 21.4 mmol/L (ref 20.0–28.0)
DRAWN BY: 234301
O2 CONTENT: 2 L/min
O2 Saturation: 88.3 %
pCO2 arterial: 51.9 mmHg — ABNORMAL HIGH (ref 32.0–48.0)
pH, Arterial: 7.273 — ABNORMAL LOW (ref 7.350–7.450)
pO2, Arterial: 66.1 mmHg — ABNORMAL LOW (ref 83.0–108.0)

## 2016-09-20 LAB — CBG MONITORING, ED: GLUCOSE-CAPILLARY: 97 mg/dL (ref 65–99)

## 2016-09-20 LAB — RAPID URINE DRUG SCREEN, HOSP PERFORMED
Amphetamines: NOT DETECTED
Barbiturates: NOT DETECTED
Benzodiazepines: POSITIVE — AB
Cocaine: NOT DETECTED
Opiates: POSITIVE — AB
Tetrahydrocannabinol: NOT DETECTED

## 2016-09-20 LAB — AMMONIA: Ammonia: 34 umol/L (ref 9–35)

## 2016-09-20 LAB — ETHANOL

## 2016-09-20 LAB — LACTIC ACID, PLASMA: LACTIC ACID, VENOUS: 0.7 mmol/L (ref 0.5–1.9)

## 2016-09-20 MED ORDER — ENOXAPARIN SODIUM 40 MG/0.4ML ~~LOC~~ SOLN
40.0000 mg | SUBCUTANEOUS | Status: DC
  Administered 2016-09-21: 40 mg via SUBCUTANEOUS
  Filled 2016-09-20: qty 0.4

## 2016-09-20 MED ORDER — SODIUM CHLORIDE 0.9 % IV SOLN
INTRAVENOUS | Status: DC
  Administered 2016-09-20: 22:00:00 via INTRAVENOUS

## 2016-09-20 MED ORDER — SODIUM CHLORIDE 0.9% FLUSH
3.0000 mL | Freq: Two times a day (BID) | INTRAVENOUS | Status: DC
  Administered 2016-09-20 – 2016-09-21 (×2): 3 mL via INTRAVENOUS

## 2016-09-20 MED ORDER — ONDANSETRON HCL 4 MG/2ML IJ SOLN: 4.0000 mg | Freq: Four times a day (QID) | INTRAMUSCULAR | Status: DC | PRN

## 2016-09-20 MED ORDER — SODIUM CHLORIDE 0.9 % IV SOLN
INTRAVENOUS | Status: DC
  Administered 2016-09-20: 14:00:00 via INTRAVENOUS

## 2016-09-20 MED ORDER — NALOXONE HCL 0.4 MG/ML IJ SOLN
0.4000 mg | INTRAMUSCULAR | Status: DC | PRN
Start: 2016-09-20 — End: 2016-09-21

## 2016-09-20 MED ORDER — ONDANSETRON HCL 4 MG PO TABS: 4.0000 mg | ORAL_TABLET | Freq: Four times a day (QID) | ORAL | Status: DC | PRN

## 2016-09-20 MED ORDER — NICOTINE 21 MG/24HR TD PT24
21.0000 mg | MEDICATED_PATCH | Freq: Every day | TRANSDERMAL | Status: DC
  Administered 2016-09-20 – 2016-09-21 (×2): 21 mg via TRANSDERMAL
  Filled 2016-09-20 (×2): qty 1

## 2016-09-20 MED ORDER — SODIUM CHLORIDE 0.9 % IV SOLN: 30.0000 meq | Freq: Once | INTRAVENOUS | Status: DC

## 2016-09-20 MED ORDER — POTASSIUM CHLORIDE 10 MEQ/100ML IV SOLN
10.0000 meq | INTRAVENOUS | Status: AC
  Administered 2016-09-20 – 2016-09-21 (×6): 10 meq via INTRAVENOUS
  Filled 2016-09-20 (×3): qty 100

## 2016-09-20 MED ORDER — PNEUMOCOCCAL VAC POLYVALENT 25 MCG/0.5ML IJ INJ
0.5000 mL | INJECTION | INTRAMUSCULAR | Status: AC
  Administered 2016-09-21: 0.5 mL via INTRAMUSCULAR
  Filled 2016-09-20: qty 0.5

## 2016-09-20 MED ORDER — ACETAMINOPHEN 650 MG RE SUPP: 650.0000 mg | Freq: Four times a day (QID) | RECTAL | Status: DC | PRN

## 2016-09-20 MED ORDER — AMMONIA AROMATIC IN INHA
RESPIRATORY_TRACT | Status: AC
  Filled 2016-09-20: qty 20

## 2016-09-20 MED ORDER — ACETAMINOPHEN 325 MG PO TABS
650.0000 mg | ORAL_TABLET | Freq: Four times a day (QID) | ORAL | Status: DC | PRN
  Administered 2016-09-21 (×2): 650 mg via ORAL
  Filled 2016-09-20 (×2): qty 2

## 2016-09-20 NOTE — ED Triage Notes (Signed)
Per boyfriend this is day 2 of patient talking out of head, Hallucinations, When ask patient why she is here " I am here to see my step father, he had hip replaced" " I feel fine" Pt is falling a sleep in triage with seizure like movement,  When you call her name she will come out of it and start talking a word salad, talking to people in the room, answers question inappropriately.

## 2016-09-20 NOTE — ED Triage Notes (Signed)
Per boyfriend she lives with her father, he has cancer and has a lot of medication including morphine and ativan. They are not sure what she has been taking.

## 2016-09-20 NOTE — ED Notes (Signed)
Pt's boyfriend Steele BergChris Curry [phone] cell number.  Mr. Clementeen GrahamCurry reports pt has been up for last 2 days.  Started with hallucinations started on 09/18/2016.  Mr. Clementeen GrahamCurry says [facility], pt was acting irrational and hallucinating.

## 2016-09-20 NOTE — ED Notes (Signed)
Mother Called to say pt father's medication is locked in a safe. He does not think she has gotten into his medication.
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
## 2016-09-20 NOTE — ED Notes (Signed)
Patient ambulated to room with no assistance or difficulty. Patient having hallucinations, speaking to people who aren't there. Boyfriend at side states "she has been like this for 2 days." Patient able to obtain urine sample on her own. Went into bathroom with patient to undress due to patient's hallucinations and confusion. Patient undressed herself and put on her own scrubs and socks while standing upright with no difficulty. Walked patient to room 15 with security at side. Patient cooperative but confused at this time. States "I need to go home NOW so I can take care of my father because he is sick with cancer." Patient tearful during this statement.

## 2016-09-20 NOTE — ED Notes (Signed)
Boyfriend remains at the bedside

## 2016-09-21 ENCOUNTER — Encounter (HOSPITAL_COMMUNITY): Payer: Self-pay | Admitting: Family Medicine

## 2016-09-21 ENCOUNTER — Observation Stay (HOSPITAL_COMMUNITY): Payer: Self-pay

## 2016-09-21 DIAGNOSIS — J9602 Acute respiratory failure with hypercapnia: Secondary | ICD-10-CM

## 2016-09-21 DIAGNOSIS — E876 Hypokalemia: Secondary | ICD-10-CM

## 2016-09-21 DIAGNOSIS — F1910 Other psychoactive substance abuse, uncomplicated: Secondary | ICD-10-CM

## 2016-09-21 DIAGNOSIS — Z720 Tobacco use: Secondary | ICD-10-CM

## 2016-09-21 LAB — CBC
HCT: 37.5 % (ref 36.0–46.0)
Hemoglobin: 12.3 g/dL (ref 12.0–15.0)
MCH: 27.8 pg (ref 26.0–34.0)
MCHC: 32.8 g/dL (ref 30.0–36.0)
MCV: 84.8 fL (ref 78.0–100.0)
PLATELETS: 336 10*3/uL (ref 150–400)
RBC: 4.42 MIL/uL (ref 3.87–5.11)
RDW: 13.9 % (ref 11.5–15.5)
WBC: 8.2 10*3/uL (ref 4.0–10.5)

## 2016-09-21 LAB — BASIC METABOLIC PANEL
Anion gap: 10 (ref 5–15)
BUN: 13 mg/dL (ref 6–20)
CALCIUM: 8.1 mg/dL — AB (ref 8.9–10.3)
CO2: 18 mmol/L — AB (ref 22–32)
CREATININE: 0.7 mg/dL (ref 0.44–1.00)
Chloride: 109 mmol/L (ref 101–111)
GFR calc Af Amer: >60 mL/min (ref >60)
GLUCOSE: 59 mg/dL — AB (ref 65–99)
Potassium: 4 mmol/L (ref 3.5–5.1)
Sodium: 137 mmol/L (ref 135–145)

## 2016-09-21 LAB — GLUCOSE, CAPILLARY: GLUCOSE-CAPILLARY: 65 mg/dL (ref 65–99)

## 2016-09-21 LAB — PHOSPHORUS: PHOSPHORUS: 2.8 mg/dL (ref 2.5–4.6)

## 2016-09-21 LAB — MAGNESIUM: Magnesium: 2.2 mg/dL (ref 1.7–2.4)

## 2016-09-21 LAB — GAMMA GT: GGT: 16 U/L (ref 7–50)

## 2016-09-21 MED ORDER — LORAZEPAM 2 MG/ML IJ SOLN: 2.0000 mg | INTRAMUSCULAR | Status: DC | PRN

## 2016-09-21 MED ORDER — ADULT MULTIVITAMIN W/MINERALS CH
1.0000 | ORAL_TABLET | Freq: Every day | ORAL | Status: DC
  Administered 2016-09-21: 1 via ORAL
  Filled 2016-09-21: qty 1

## 2016-09-21 MED ORDER — DEXTROSE-NACL 5-0.9 % IV SOLN
INTRAVENOUS | Status: DC
  Administered 2016-09-21: 10:00:00 via INTRAVENOUS

## 2016-09-21 MED ORDER — VITAMIN B-1 100 MG PO TABS
100.0000 mg | ORAL_TABLET | Freq: Every day | ORAL | Status: DC
  Administered 2016-09-21: 100 mg via ORAL
  Filled 2016-09-21: qty 1

## 2016-09-21 MED ORDER — FOLIC ACID 1 MG PO TABS
1.0000 mg | ORAL_TABLET | Freq: Every day | ORAL | Status: DC
  Administered 2016-09-21: 1 mg via ORAL
  Filled 2016-09-21: qty 1
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
## 2016-09-21 NOTE — Progress Notes (Signed)
[facility] [employer] to inform him of the consult for this patient.

## 2016-09-21 NOTE — Progress Notes (Signed)
Pt's IV catheter removed and intact. Pt's IV site clean dry and intact. Discharge instructions including medications and follow up appointments were reviewed and discussed with patient. All questions were answered and no further questions at this time. Pt verbalized understanding of discharge instructions.  Pt in stable condition and in no acute distress at time of discharge. Pt will be escorted by nurse tech.  

## 2016-09-21 NOTE — Discharge Instructions (Signed)

## 2016-09-21 NOTE — Progress Notes (Signed)
Pt current awakes and can converse appropriately, sitter is in room.

## 2016-09-21 NOTE — Progress Notes (Signed)
Pt requesting to have her medications such as Xanax, Hydrocodone, Meloxicam, and Claritin. MD notified via E-Page.

## 2016-09-21 NOTE — Progress Notes (Signed)
Pt admitted to room asleep but arousable in no obvious distress. Pt is in approved gown for situation. Room has been secured and searched as protocol dictates.
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
## 2016-09-21 NOTE — Progress Notes (Signed)
PROGRESS NOTE    Faith ScottsJennifer B Santos  ZOX:[id number]  DOB: 1980-12-01  DOA: 09/20/2016 PCP: Toma DeitersXAJE A HASANAJ, MD Outpatient Specialists:   Hospital course: Pt presented to ED with altered mental status, drug overdose from benzodiazepines / opioids.  Intention unknown at this time.    Assessment & Plan:   Hypercapneic respiratory failure - Pt has clinically improved, requiring no respiratory support at this time, repeating CXR this morning after hydration to make sure that she didn't aspirate.   Polysubstance abuse - Pt denies that she attempted to harm herself and reports that she only took her xanax and hydrocodone as prescribed.  She says that she only took 1 tablet each.  Awaiting for social worker and psychiatry evaluation.    Hypokalemia - repleted IV.    Hypoglycemia - add dextrose to IVFs and start diet this morning.   Dehydration - continue IVF hydration      DVT prophylaxis: lovenox Code Status: full Family Communication: BF Disposition Plan: TBD    Consultants:  Psychiatry  Social worker  Subjective: Pt says she feels much better and wants to eat.  Says she didn't try to self harm.    Objective: Vitals:   09/21/16 0300 09/21/16 0400 09/21/16 0600 09/21/16 0700  BP: 105/63 (!) 100/56 (!) 101/50 111/71  Pulse: 83 69 76 78  Resp: 20 18 15 20   Temp:      TempSrc:      SpO2: 97% 97% 98% 97%  Weight:      Height:        Intake/Output Summary (Last 24 hours) at 09/21/16 0731 Last data filed at 09/21/16 0700  Gross per 24 hour  Intake          1508.33 ml  Output                0 ml  Net          1508.33 ml   Filed Weights   09/20/16 1214  Weight: 72.1 kg (159 lb)     Exam:  General exam: awake, alert, no distress, cooperative. HEENT: dry mucus membranes.  Respiratory system: Clear. No increased work of breathing. Cardiovascular system: S1 & S2 heard, RRR. No JVD, murmurs, gallops, clicks or pedal edema. Gastrointestinal system: Abdomen is  nondistended, soft and nontender. Normal bowel sounds heard. Central nervous system: Alert and oriented. No focal neurological deficits. Extremities: no CCE.  Data Reviewed: Basic Metabolic Panel:  Recent Labs Lab 09/20/16 1253 09/21/16 0407  NA 134* 137  K 3.0* 4.0  CL 103 109  CO2 23 18*  GLUCOSE 107* 59*  BUN 17 13  CREATININE 0.81 0.70  CALCIUM 8.6* 8.1*   Liver Function Tests:  Recent Labs Lab 09/20/16 1253  AST 62*  ALT 33  ALKPHOS 64  BILITOT 0.9  PROT 6.6  ALBUMIN 3.9   No results for input(s): LIPASE, AMYLASE in the last 168 hours.  Recent Labs Lab 09/20/16 1253  AMMONIA 34   CBC:  Recent Labs Lab 09/20/16 1253 09/21/16 0407  WBC 10.8* 8.2  NEUTROABS 7.9*  --   HGB 11.8* 12.3  HCT 35.8* 37.5  MCV 83.6 84.8  PLT 331 336   Cardiac Enzymes: No results for input(s): CKTOTAL, CKMB, CKMBINDEX, TROPONINI in the last 168 hours. CBG (last 3)   Recent Labs  09/20/16 1344  GLUCAP 97   Recent Results (from the past 240 hour(s))  MRSA PCR Screening     Status: None   Collection Time:  09/20/16  8:19 PM  Result Value Ref Range Status   MRSA by PCR NEGATIVE NEGATIVE Final    Comment:        The GeneXpert MRSA Assay (FDA approved for NASAL specimens only), is one component of a comprehensive MRSA colonization surveillance program. It is not intended to diagnose MRSA infection nor to guide or monitor treatment for MRSA infections.      Studies: Dg Chest 1 View  Result Date: 09/20/2016 CLINICAL DATA:  Confusion EXAM: CHEST 1 VIEW COMPARISON:  Chest CT November 29, 2014. FINDINGS: There is no edema or consolidation. The heart size and pulmonary vascularity are normal. No adenopathy. No pneumothorax. No bone lesions. IMPRESSION: No edema or consolidation. Electronically Signed   By: Bretta Bang III M.D.   On: 09/20/2016 13:17   Ct Head Wo Contrast  Result Date: 09/20/2016 CLINICAL DATA:  Hallucinations for 2 days.  Altered mental status.  EXAM: CT HEAD WITHOUT CONTRAST TECHNIQUE: Contiguous axial images were obtained from the base of the skull through the vertex without intravenous contrast. COMPARISON:  None. FINDINGS: Brain: No evidence of acute infarction, hemorrhage, hydrocephalus, extra-axial collection or mass lesion/mass effect. Vascular: No hyperdense vessel or unexpected calcification. Skull: No osseous abnormality. Sinuses/Orbits: Visualized paranasal sinuses are clear. Visualized mastoid sinuses are clear. Visualized orbits demonstrate no focal abnormality. Other: None IMPRESSION: No acute intracranial pathology. Electronically Signed   By: Elige Ko   On: 09/20/2016 13:26   Scheduled Meds: . sodium chloride   Intravenous STAT  . enoxaparin (LOVENOX) injection  40 mg Subcutaneous Q24H  . nicotine  21 mg Transdermal Daily  . pneumococcal 23 valent vaccine  0.5 mL Intramuscular Tomorrow-1000  . sodium chloride flush  3 mL Intravenous Q12H   Continuous Infusions: . dextrose 5 % and 0.9% NaCl      Principal Problem:   Hypercapnic respiratory failure (HCC) Active Problems:   Tobacco abuse   Polysubstance abuse   Hypokalemia  Time spent:   Standley Dakins, MD, FAAFP [facility] Pager [phone]  If 7PM-7AM, please contact night-coverage [email or web address] Password [email or web address] 09/21/2016, 7:31 AM    LOS: 0 days

## 2016-09-21 NOTE — BH Assessment (Signed)
Per NP Elta GuadeloupeLaurie Santos - patient does not meet criteria for inpatient hospitalization. Patient will be given outpatient resources by the social worker on the medical floor.  Writer informed the nurse working with the patient of the disposition. Writer paged Dr. Laural BenesJohnson at [phone] in order to inform him of the disposition.

## 2016-09-21 NOTE — Progress Notes (Signed)
Called Behavior[facility] to make sure they knew about the consult for this patient. Left message on the answering machine.
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
## 2016-09-21 NOTE — Clinical Social Work Note (Signed)
Per TTS, pt has been psychiatrically cleared. CSW followed up to provide outpatient resources. Pt accepted list, but states she has no intentions of making an appointment.   Derenda FennelKara Zakaree Mcclenahan, LCSW [phone]

## 2018-03-24 IMAGING — DX DG LUMBAR SPINE COMPLETE 4+V
5 series · 5 of 5 positions shown · non-contrast
Comparison: Lumbar spine radiographs January 15, 2016

CLINICAL DATA: Slipped and fell down 6 steps. Low back pain
radiating to bilateral hips. History of chronic back pain.

EXAM:
LUMBAR SPINE - COMPLETE 4+ VIEW

[l-spine ap]
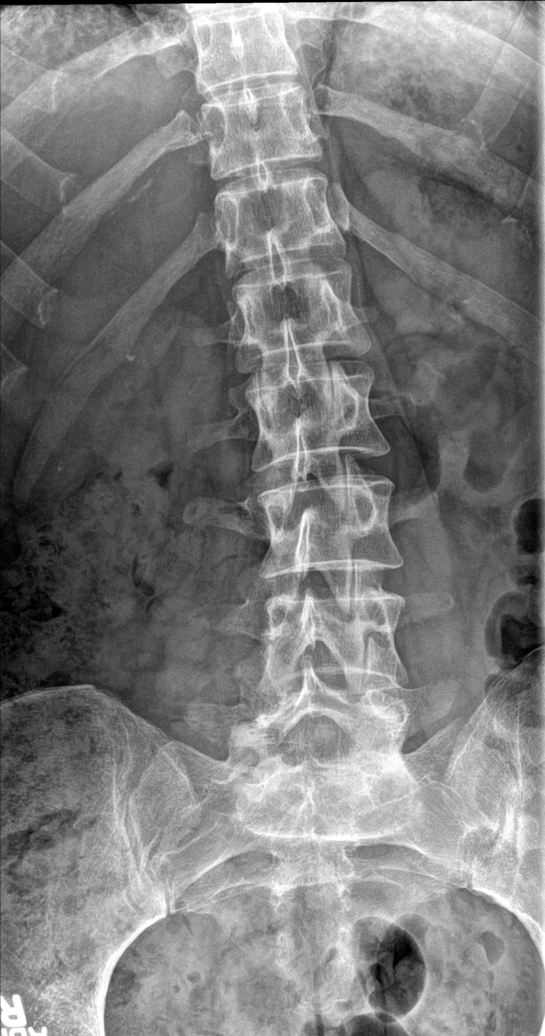

[l-spine obl (1 of 2)]
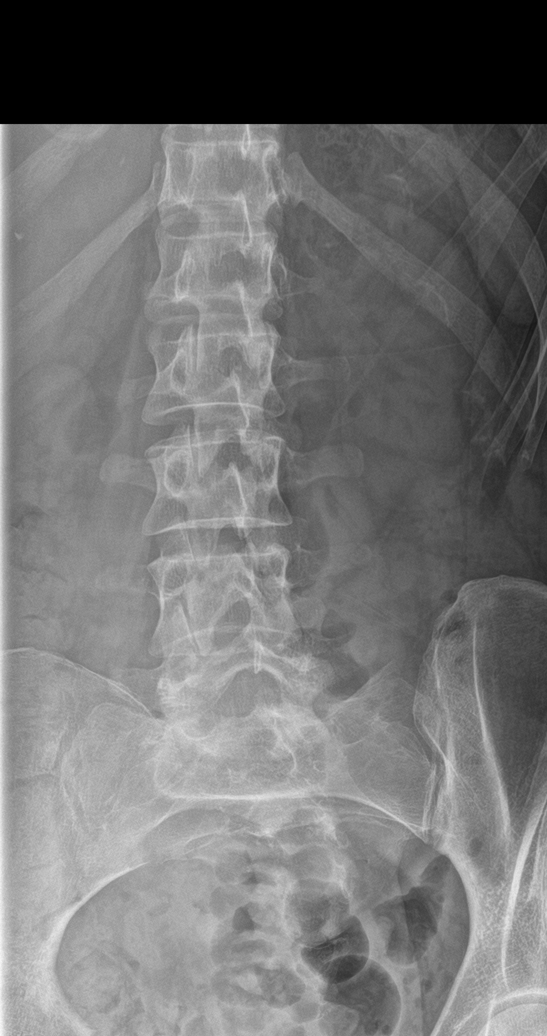

[l-spine obl (2 of 2)]
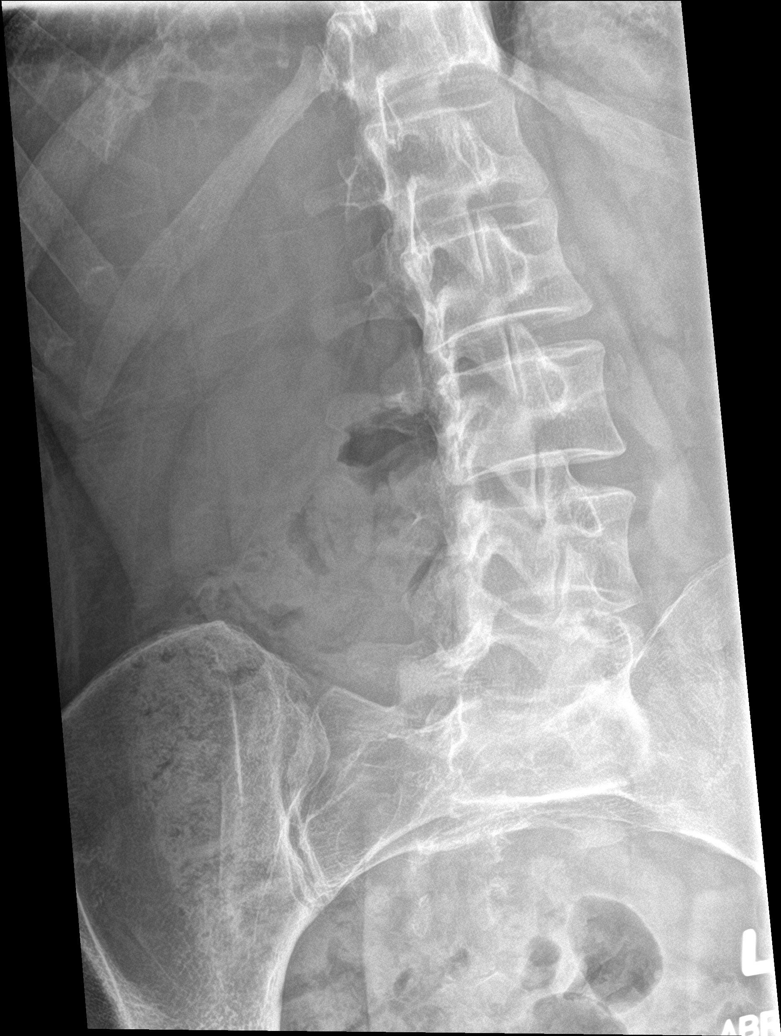

[l-spine lat]
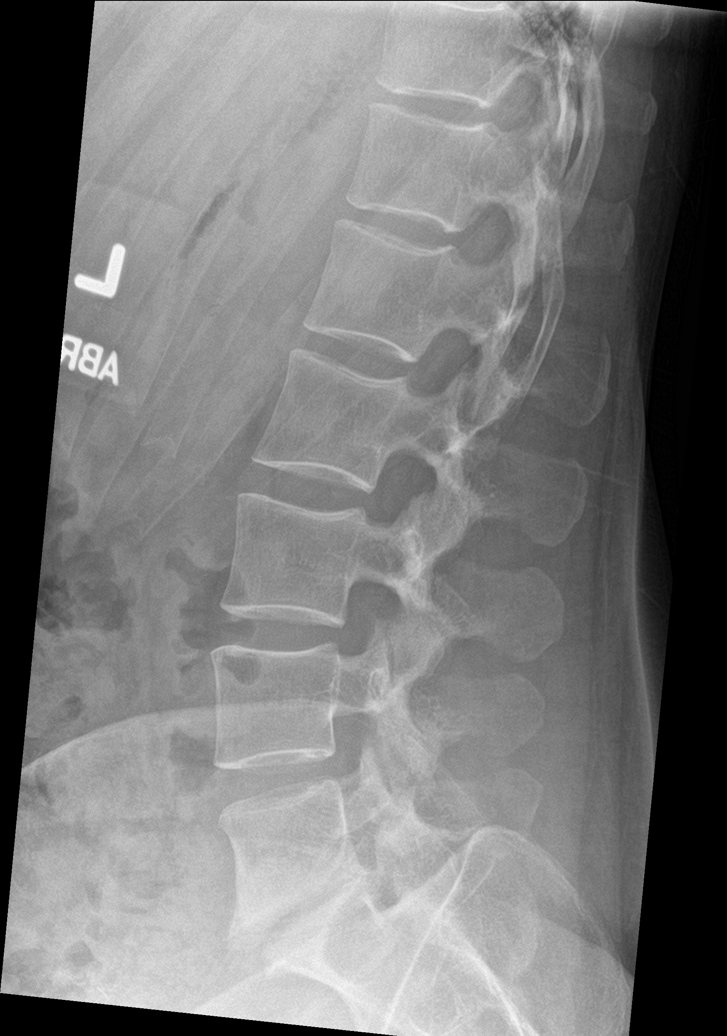

[l-spine spot]
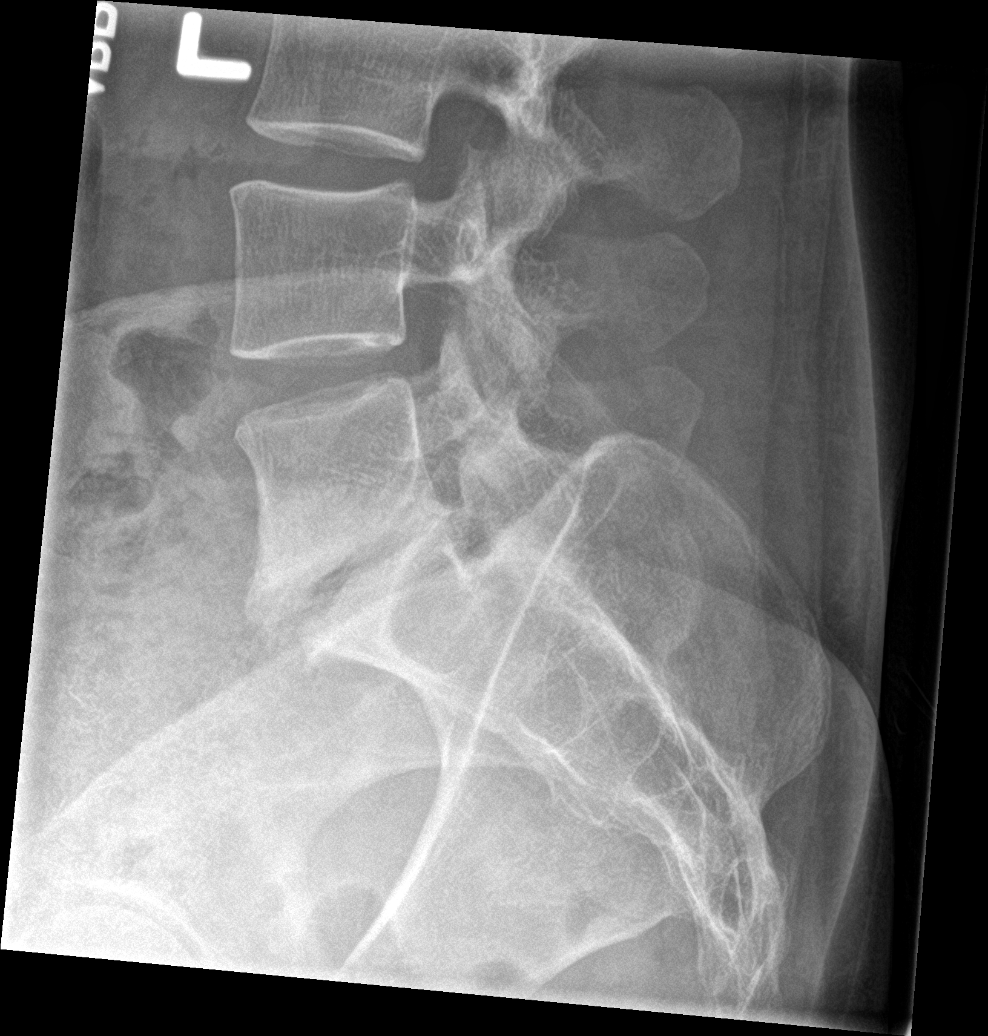

[5 of 5 positions shown; findings below may reference images not displayed]

FINDINGS: Five non rib-bearing lumbar-type vertebral bodies are intact and
aligned with maintenance of the lumbar lordosis. Broad levoscoliosis
with rotary component on this nonweightbearing examination. Stable
severe L5-S1 disc height loss, with endplate sclerosis and marginal
spurring compatible with degenerative disc. No pars interarticularis
defect. No destructive bony lesions.

Sacroiliac joints are symmetric. Included prevertebral and
paraspinal soft tissue planes are non-suspicious.
IMPRESSION: No acute lumbar spine fracture deformity or malalignment.

Stable severe L5-S1 degenerative change.

## 2018-03-24 IMAGING — DX DG HIP (WITH OR WITHOUT PELVIS) 2V BILAT
5 series · 5 of 5 positions shown · non-contrast
Comparison: None.

CLINICAL DATA: Low back pain radiating to both hips. Slip and fall
injury.

EXAM:
DG HIP (WITH OR WITHOUT PELVIS) 2V BILAT

[pelvis ap]
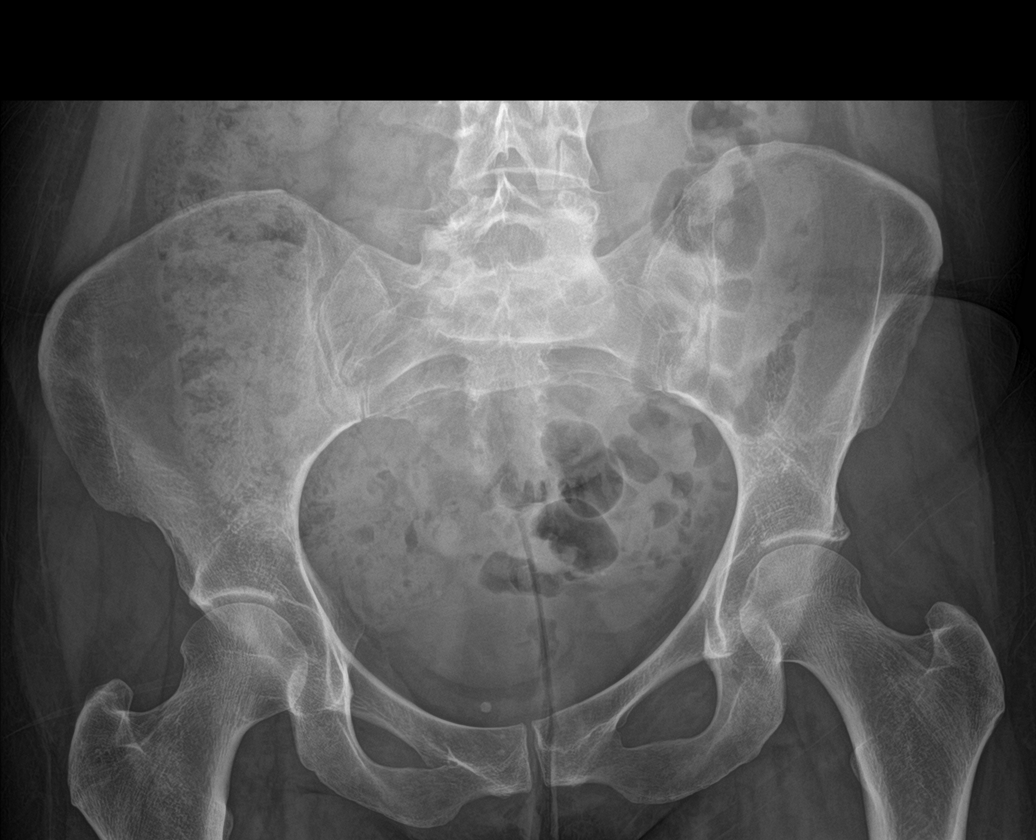

[hip ap (1 of 2)]
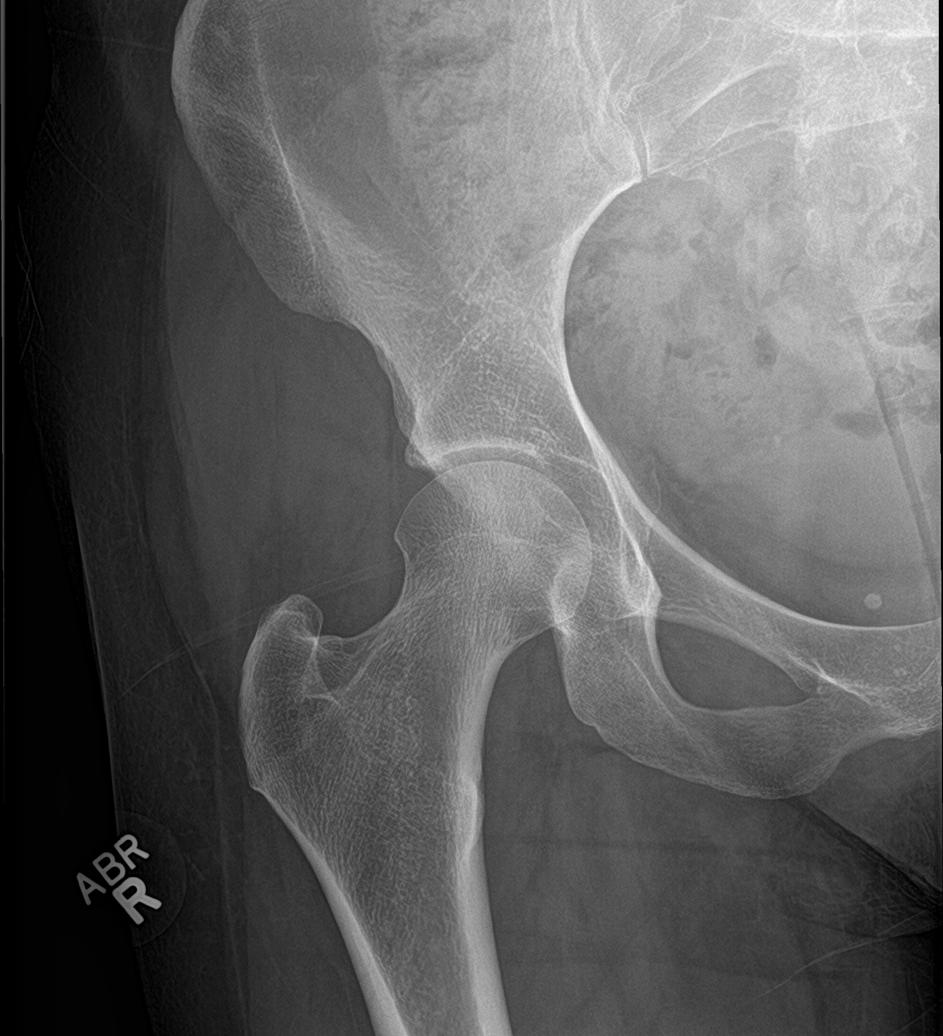

[hip lat (1 of 2)]
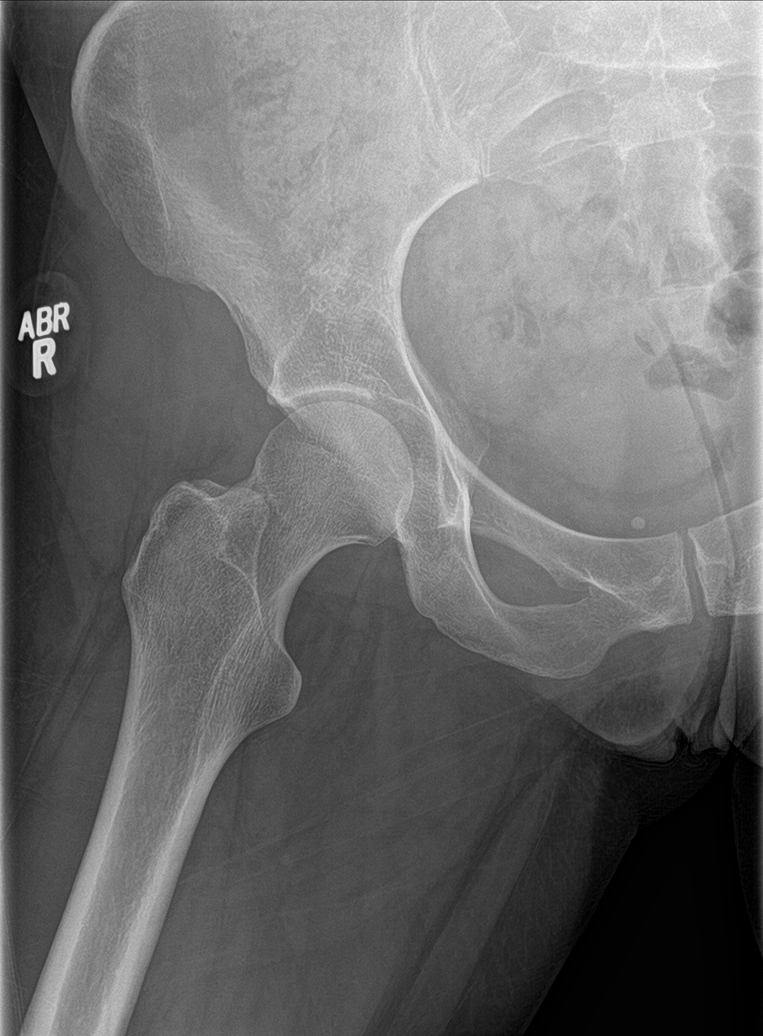

[hip ap (2 of 2)]
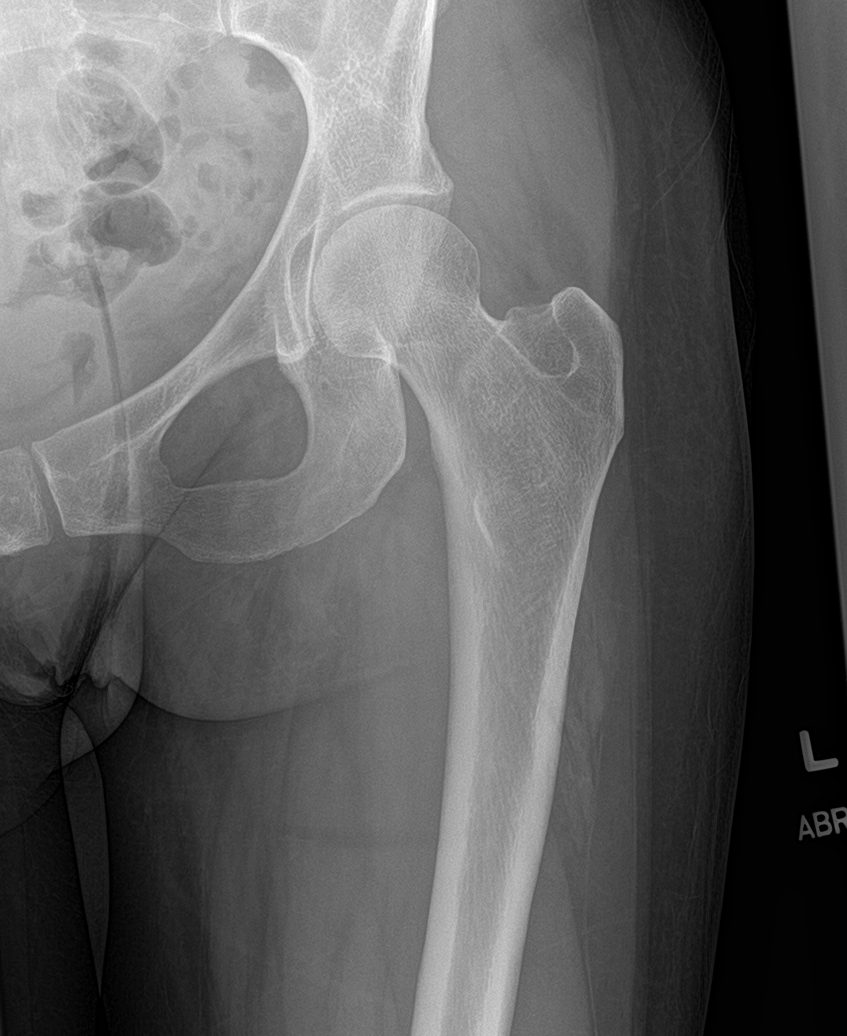

[hip lat (2 of 2)]
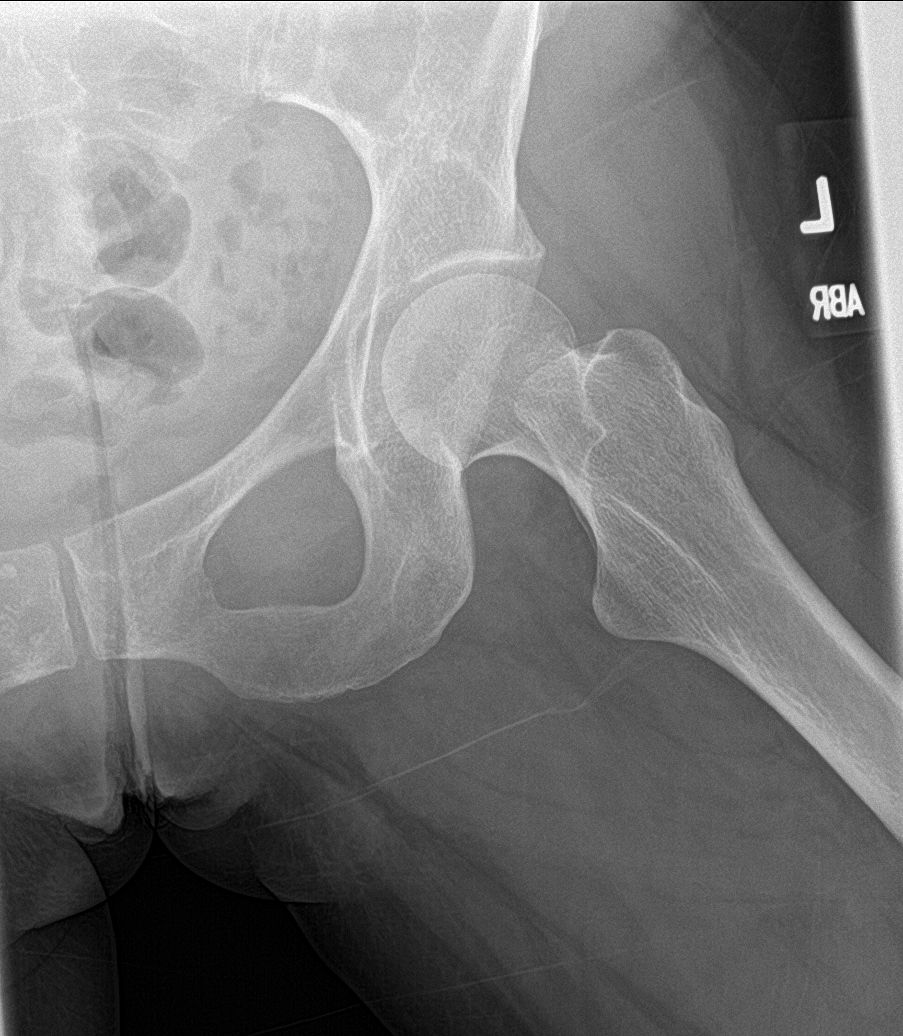

[5 of 5 positions shown; findings below may reference images not displayed]

FINDINGS: There is no evidence of hip fracture or dislocation. There is no
evidence of arthropathy or other focal bone abnormality.
IMPRESSION: Negative.

## 2019-01-13 ENCOUNTER — Emergency Department (HOSPITAL_COMMUNITY)
Admission: EM | Admit: 2019-01-13 | Discharge: 2019-01-13 | Disposition: A | Discharge status: Home / Self Care | Payer: Self-pay | Attending: Emergency Medicine | Admitting: Emergency Medicine

## 2019-01-13 ENCOUNTER — Other Ambulatory Visit: Payer: Self-pay

## 2019-01-13 ENCOUNTER — Encounter (HOSPITAL_COMMUNITY): Payer: Self-pay | Admitting: *Deleted

## 2019-01-13 ENCOUNTER — Emergency Department (HOSPITAL_COMMUNITY): Payer: Self-pay

## 2019-01-13 DIAGNOSIS — W010XXA Fall on same level from slipping, tripping and stumbling without subsequent striking against object, initial encounter: Secondary | ICD-10-CM | POA: Insufficient documentation

## 2019-01-13 DIAGNOSIS — S93402A Sprain of unspecified ligament of left ankle, initial encounter: Secondary | ICD-10-CM | POA: Insufficient documentation

## 2019-01-13 DIAGNOSIS — Y92014 Private driveway to single-family (private) house as the place of occurrence of the external cause: Secondary | ICD-10-CM | POA: Insufficient documentation

## 2019-01-13 DIAGNOSIS — Y998 Other external cause status: Secondary | ICD-10-CM | POA: Insufficient documentation

## 2019-01-13 DIAGNOSIS — F17210 Nicotine dependence, cigarettes, uncomplicated: Secondary | ICD-10-CM | POA: Insufficient documentation

## 2019-01-13 DIAGNOSIS — Y9301 Activity, walking, marching and hiking: Secondary | ICD-10-CM | POA: Insufficient documentation

## 2019-01-13 MED ORDER — HYDROCODONE-ACETAMINOPHEN 5-325 MG PO TABS
2.0000 | ORAL_TABLET | Freq: Once | ORAL | Status: AC
  Administered 2019-01-13: 2 via ORAL
  Filled 2019-01-13: qty 2

## 2019-01-13 MED ORDER — HYDROCODONE-ACETAMINOPHEN 5-325 MG PO TABS: 1.0000 | ORAL_TABLET | ORAL | 0 refills | Status: AC | PRN

## 2019-01-13 MED ORDER — IBUPROFEN 600 MG PO TABS: 600.0000 mg | ORAL_TABLET | Freq: Four times a day (QID) | ORAL | 0 refills | Status: AC | PRN

## 2019-01-13 NOTE — Discharge Instructions (Addendum)
Schedule to see the Orthopaedist if pain persist  
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
## 2019-01-13 NOTE — ED Notes (Signed)
Pt returned from xray

## 2019-01-13 NOTE — ED Triage Notes (Signed)
Pt c/o left lower leg and ankle pain; pt states she tripped and fell today twisting her left ankle; ankle is red and swollen and tender to touch; pt has limited ROM

## 2019-02-16 DEATH — deceased
